# Patient Record
Sex: Male | Born: 1985 | Race: White | Hispanic: No | Marital: Single | Smoking: Never smoker
Health system: Southern US, Community
[De-identification: ages and names within clinical notes are randomized; demographics above are authoritative.]

## PROBLEM LIST (undated history)

## (undated) DIAGNOSIS — G43909 Migraine, unspecified, not intractable, without status migrainosus: Secondary | ICD-10-CM

## (undated) HISTORY — DX: Migraine, unspecified, not intractable, without status migrainosus: G43.909

---

## 2004-04-17 ENCOUNTER — Ambulatory Visit: Payer: Self-pay | Admitting: Internal Medicine

## 2004-05-19 ENCOUNTER — Ambulatory Visit: Payer: Self-pay | Admitting: Internal Medicine

## 2005-05-01 ENCOUNTER — Ambulatory Visit: Payer: Self-pay | Admitting: Family Medicine

## 2005-05-04 ENCOUNTER — Ambulatory Visit: Payer: Self-pay | Admitting: Internal Medicine

## 2005-05-06 ENCOUNTER — Ambulatory Visit: Payer: Self-pay | Admitting: Internal Medicine

## 2005-05-13 ENCOUNTER — Ambulatory Visit: Payer: Self-pay | Admitting: Internal Medicine

## 2005-06-15 ENCOUNTER — Ambulatory Visit: Payer: Self-pay | Admitting: Internal Medicine

## 2006-06-06 ENCOUNTER — Ambulatory Visit: Payer: Self-pay | Admitting: Internal Medicine

## 2008-08-07 ENCOUNTER — Ambulatory Visit: Payer: Self-pay | Admitting: Internal Medicine

## 2008-08-07 DIAGNOSIS — D485 Neoplasm of uncertain behavior of skin: Secondary | ICD-10-CM

## 2008-08-08 ENCOUNTER — Ambulatory Visit: Payer: Self-pay | Admitting: Internal Medicine

## 2008-08-12 LAB — CONVERTED CEMR LAB
ALT: 11 units/L (ref 0–53)
AST: 15 units/L (ref 0–37)
Albumin: 4.3 g/dL (ref 3.5–5.2)
Alkaline Phosphatase: 44 units/L (ref 39–117)
Chloride: 102 meq/L (ref 96–112)
Cholesterol: 179 mg/dL (ref 0–200)
Eosinophils Absolute: 0.1 10*3/uL (ref 0.0–0.7)
Eosinophils Relative: 1.5 % (ref 0.0–5.0)
Ketones, ur: NEGATIVE mg/dL
Leukocytes, UA: NEGATIVE
Lymphocytes Relative: 27.6 % (ref 12.0–46.0)
MCHC: 34.1 g/dL (ref 30.0–36.0)
MCV: 90.6 fL (ref 78.0–100.0)
Neutro Abs: 3.3 10*3/uL (ref 1.4–7.7)
Nitrite: NEGATIVE
Platelets: 173 10*3/uL (ref 150.0–400.0)
RBC: 5.37 M/uL (ref 4.22–5.81)
RDW: 11.7 % (ref 11.5–14.6)
Sodium: 141 meq/L (ref 135–145)
TSH: 1.84 microintl units/mL (ref 0.35–5.50)
Total Protein, Urine: NEGATIVE mg/dL
Urobilinogen, UA: 0.2 (ref 0.0–1.0)
WBC: 5.3 10*3/uL (ref 4.5–10.5)
pH: 7 (ref 5.0–8.0)

## 2008-08-14 ENCOUNTER — Emergency Department (HOSPITAL_BASED_OUTPATIENT_CLINIC_OR_DEPARTMENT_OTHER): Admission: EM | Admit: 2008-08-14 | Discharge: 2008-08-14 | Payer: Self-pay | Admitting: Emergency Medicine

## 2008-08-14 ENCOUNTER — Ambulatory Visit: Payer: Self-pay | Admitting: Internal Medicine

## 2009-01-10 ENCOUNTER — Ambulatory Visit: Payer: Self-pay | Admitting: Internal Medicine

## 2009-01-10 DIAGNOSIS — G43809 Other migraine, not intractable, without status migrainosus: Secondary | ICD-10-CM | POA: Insufficient documentation

## 2009-01-20 ENCOUNTER — Ambulatory Visit: Payer: Self-pay | Admitting: Cardiology

## 2009-06-20 ENCOUNTER — Ambulatory Visit: Payer: Self-pay | Admitting: Internal Medicine

## 2010-05-01 ENCOUNTER — Ambulatory Visit
Admission: RE | Admit: 2010-05-01 | Discharge: 2010-05-01 | Payer: Self-pay | Source: Home / Self Care | Attending: Internal Medicine | Admitting: Internal Medicine

## 2010-05-01 DIAGNOSIS — M79609 Pain in unspecified limb: Secondary | ICD-10-CM | POA: Insufficient documentation

## 2010-05-25 ENCOUNTER — Ambulatory Visit: Admit: 2010-05-25 | Payer: Self-pay | Admitting: Sports Medicine

## 2010-05-29 ENCOUNTER — Telehealth: Payer: Self-pay | Admitting: Internal Medicine

## 2010-06-02 NOTE — Assessment & Plan Note (Signed)
Summary: DR AVP PT COUGH FEVER-VOICE GONE-STC   Vital Signs:  Patient profile:   25 year old male Height:      76 inches Weight:      229 pounds BMI:     27.98 O2 Sat:      97 % on Room air Temp:     99.6 degrees F oral Pulse rate:   85 / minute Pulse rhythm:   regular Resp:     16 per minute BP sitting:   120 / 80  (left arm) Cuff size:   large  Vitals Entered By: Rock Nephew CMA (June 20, 2009 2:53 PM)  Nutrition Counseling: Patient's BMI is greater than 25 and therefore counseled on weight management options.  O2 Flow:  Room air CC: congestion, fever, loss of voice x this am, URI symptoms Is Patient Diabetic? No Pain Assessment Patient in pain? no        Primary Care Provider:  Georgina Quint Plotnikov MD  CC:  congestion, fever, loss of voice x this am, and URI symptoms.  History of Present Illness:  URI Symptoms      This is a 25 year old man who presents with URI symptoms.  The symptoms began 2 days ago.  The severity is described as moderate.  The patient reports nasal congestion, clear nasal discharge, sore throat, and productive cough, but denies earache and sick contacts.  Associated symptoms include low-grade fever (<100.5 degrees).  The patient denies stiff neck, dyspnea, wheezing, rash, vomiting, diarrhea, and use of an antipyretic.  The patient also reports severe fatigue.  The patient denies headache and muscle aches.  The patient denies the following risk factors for Strep sinusitis: unilateral facial pain, unilateral nasal discharge, poor response to decongestant, Strep exposure, tender adenopathy, and absence of cough.    Preventive Screening-Counseling & Management  Alcohol-Tobacco     Alcohol drinks/day: 0     Smoking Status: never  Hep-HIV-STD-Contraception     Hepatitis Risk: no risk noted     HIV Risk: no risk noted     STD Risk: no risk noted      Sexual History:  currently monogamous.        Drug Use:  never.        Blood Transfusions:   no.    Medications Prior to Update: 1)  Imitrex 100 Mg Tabs (Sumatriptan Succinate) .Marland Kitchen.. 1 By Mouth Once Daily As Needed Migraine 2)  Ibuprofen 600 Mg  Tabs (Ibuprofen) .Marland Kitchen.. 1 By Mouth Three Times A Day As Needed Pain 3)  Vitamin D3 1000 Unit  Tabs (Cholecalciferol) .Marland Kitchen.. 1 By Mouth Daily  Current Medications (verified): 1)  Imitrex 100 Mg Tabs (Sumatriptan Succinate) .Marland Kitchen.. 1 By Mouth Once Daily As Needed Migraine 2)  Ibuprofen 600 Mg  Tabs (Ibuprofen) .Marland Kitchen.. 1 By Mouth Three Times A Day As Needed Pain 3)  Vitamin D3 1000 Unit  Tabs (Cholecalciferol) .Marland Kitchen.. 1 By Mouth Daily 4)  Guaifenesin Dac 30-10-100 Mg/64ml Soln (Pseudoephedrine-Codeine-Gg) .... 5-10 Ml By Mouth Qid As Needed For Cough  Allergies (verified): 1)  ! Sodium Sulfate (Sodium Sulfate)  Past History:  Past Medical History: Reviewed history from 01/10/2009 and no changes required. Mono Migraines  Past Surgical History: Reviewed history from 08/07/2008 and no changes required. Denies surgical history  Social History: Hepatitis Risk:  no risk noted HIV Risk:  no risk noted STD Risk:  no risk noted Sexual History:  currently monogamous Drug Use:  never Blood Transfusions:  no  Review of Systems  The patient denies anorexia, fever, weight loss, chest pain, abdominal pain, suspicious skin lesions, and enlarged lymph nodes.    Physical Exam  General:  Well-developed,well-nourished,in no acute distress; alert,appropriate and cooperative throughout examination. he speaks in a whispered voice but has no resp. distress or dysphagia. Head:  normocephalic and atraumatic.   Ears:  R ear normal and L ear normal.   Nose:  External nasal examination shows no deformity or inflammation. Nasal mucosa are pink and moist without lesions or exudates. Mouth:  Oral mucosa and oropharynx without lesions or exudates.  Teeth in good repair. Neck:  No deformities, masses, or tenderness noted. Lungs:  Normal respiratory effort, chest expands  symmetrically. Lungs are clear to auscultation, no crackles or wheezes. Heart:  Normal rate and regular rhythm. S1 and S2 normal without gallop, murmur, click, rub or other extra sounds. Abdomen:  Bowel sounds positive,abdomen soft and non-tender without masses, organomegaly or hernias noted. Msk:  No deformity or scoliosis noted of thoracic or lumbar spine.   Pulses:  R and L carotid,radial,femoral,dorsalis pedis and posterior tibial pulses are full and equal bilaterally Extremities:  No clubbing, cyanosis, edema, or deformity noted with normal full range of motion of all joints.   Neurologic:  No cranial nerve deficits noted. Station and gait are normal. Plantar reflexes are down-going bilaterally. DTRs are symmetrical throughout. Sensory, motor and coordinative functions appear intact. Skin:  Intact without suspicious lesions or rashes Cervical Nodes:  no anterior cervical adenopathy and no posterior cervical adenopathy.   Axillary Nodes:  no R axillary adenopathy and no L axillary adenopathy.   Psych:  Cognition and judgment appear intact. Alert and cooperative with normal attention span and concentration. No apparent delusions, illusions, hallucinations   Impression & Recommendations:  Problem # 1:  COUGH (ICD-786.2) Assessment New  Orders: T-2 View CXR (71020TC)  Problem # 2:  FEVER (ICD-780.60) Assessment: New  Orders: T-2 View CXR (71020TC)  Problem # 3:  BRONCHITIS-ACUTE (ICD-466.0) Assessment: New  start Zithromax for common bacterial and atypical causes. His updated medication list for this problem includes:    Guaifenesin Dac 30-10-100 Mg/64ml Soln (Pseudoephedrine-codeine-gg) .Marland Kitchen... 5-10 ml by mouth qid as needed for cough  Take antibiotics and other medications as directed. Encouraged to push clear liquids, get enough rest, and take acetaminophen as needed. To be seen in 5-7 days if no improvement, sooner if worse.  Complete Medication List: 1)  Imitrex 100 Mg Tabs  (Sumatriptan succinate) .Marland Kitchen.. 1 by mouth once daily as needed migraine 2)  Ibuprofen 600 Mg Tabs (Ibuprofen) .Marland Kitchen.. 1 by mouth three times a day as needed pain 3)  Vitamin D3 1000 Unit Tabs (Cholecalciferol) .Marland Kitchen.. 1 by mouth daily 4)  Guaifenesin Dac 30-10-100 Mg/72ml Soln (Pseudoephedrine-codeine-gg) .... 5-10 ml by mouth qid as needed for cough  Patient Instructions: 1)  Please schedule a follow-up appointment in 2 weeks. 2)  Take your antibiotic as prescribed until ALL of it is gone, but stop if you develop a rash or swelling and contact our office as soon as possible. 3)  Acute bronchitis symptoms for less than 10 days are not helped by antibiotics. take over the counter cough medications. call if no improvment in  5-7 days, sooner if increasing cough, fever, or new symptoms( shortness of breath, chest pain). Prescriptions: GUAIFENESIN DAC 30-10-100 MG/5ML SOLN (PSEUDOEPHEDRINE-CODEINE-GG) 5-10 ml by mouth QID as needed for cough  #6 ounces x 0   Entered and Authorized by:   Bernadene Bell.  Yetta Barre MD   Signed by:   Etta Grandchild MD on 06/20/2009   Method used:   Print then Give to Patient   RxID:   [id number] ZITHROMAX TRI-PAK 500 MG TAB (AZITHROMYCIN) Take one by mouth once daily for 3 days  #3 x 0   Entered and Authorized by:   Etta Grandchild MD   Signed by:   Etta Grandchild MD on 06/20/2009   Method used:   Print then Give to Patient   RxID:   [id number]

## 2010-06-04 NOTE — Assessment & Plan Note (Signed)
Summary: foot injury/nws   Vital Signs:  Patient profile:   25 year old male Height:      76 inches Weight:      248 pounds BMI:     30.30 Temp:     97.4 degrees F oral Pulse rate:   72 / minute Pulse rhythm:   regular Resp:     16 per minute BP sitting:   140 / 100  (left arm) Cuff size:   regular  Vitals Entered By: Lanier Prude, Beverly Gust) (May 01, 2010 4:15 PM) CC: Rt leg pain below knee from injury/fall 2 wks ago Is Patient Diabetic? No Comments pt states he does not take Imitrex, ibuprofen, vit d or Guaifenesin   Primary Care Provider:  Tresa Garter MD  CC:  Rt leg pain below knee from injury/fall 2 wks ago.  History of Present Illness: C/o R leg pain x several weeks. It started after he fell backwards with the weight on R heel - he developed sudden pain in proximal and posterolat. shin. It is worse w/activity  Preventive Screening-Counseling & Management  Caffeine-Diet-Exercise     Does Patient Exercise: yes  Current Medications (verified): 1)  Imitrex 100 Mg Tabs (Sumatriptan Succinate) .Marland Kitchen.. 1 By Mouth Once Daily As Needed Migraine 2)  Ibuprofen 600 Mg  Tabs (Ibuprofen) .Marland Kitchen.. 1 By Mouth Three Times A Day As Needed Pain 3)  Vitamin D3 1000 Unit  Tabs (Cholecalciferol) .Marland Kitchen.. 1 By Mouth Daily 4)  Guaifenesin Dac 30-10-100 Mg/4ml Soln (Pseudoephedrine-Codeine-Gg) .... 5-10 Ml By Mouth Qid As Needed For Cough 5)  One-A-Day Mens  Tabs (Multiple Vitamin) .Marland Kitchen.. 1 By Mouth Once Daily  Allergies (verified): 1)  ! Sodium Sulfate (Sodium Sulfate)  Past History:  Past Medical History: Last updated: 01/10/2009 Mono Migraines  Social History: Occupation: Biology major Single Never Smoked Alcohol use-no Regular exercise-yes - self defence Does Patient Exercise:  yes  Review of Systems  The patient denies fever.         No LBP, no foot pain  Physical Exam  General:  Well-developed,well-nourished,in no acute distress; alert,appropriate and  cooperative throughout examination Msk:  R leg is NT Pulses:  R and L carotid,radial,femoral,dorsalis pedis and posterior tibial pulses are full and equal bilaterally Extremities:  months edema B Neurologic:  No cranial nerve deficits noted. Station and gait are normal. Plantar reflexes are down-going bilaterally. DTRs are symmetrical throughout. Sensory, motor and coordinative functions appear intact. Skin:  Feet w/old scars Psych:  Cognition and judgment appear intact. Alert and cooperative with normal attention span and concentration. No apparent delusions, illusions, hallucinations   Impression & Recommendations:  Problem # 1:  LEG PAIN (ICD-729.5) R lat, MSK Assessment New  Will give a course of Ibuprofen Sports Med consult Orders: T-Tib/Fib Right (73590TC)  Complete Medication List: 1)  Imitrex 100 Mg Tabs (Sumatriptan succinate) .Marland Kitchen.. 1 by mouth once daily as needed migraine 2)  Ibuprofen 600 Mg Tabs (Ibuprofen) .Marland Kitchen.. 1 by mouth two times a day x 2 wks then as needed pain 3)  Vitamin D3 1000 Unit Tabs (Cholecalciferol) .Marland Kitchen.. 1 by mouth daily 4)  Guaifenesin Dac 30-10-100 Mg/68ml Soln (Pseudoephedrine-codeine-gg) .... 5-10 ml by mouth qid as needed for cough 5)  One-a-day Mens Tabs (Multiple vitamin) .Marland Kitchen.. 1 by mouth once daily  Other Orders: Sports Medicine (Sports Med)  Patient Instructions: 1)  Call if you are not better in a reasonable amount of time or if worse.  Prescriptions: IBUPROFEN 600 MG  TABS (IBUPROFEN)  1 by mouth two times a day x 2 wks then as needed pain  #60 x 1   Entered and Authorized by:   Tresa Garter MD   Signed by:   Tresa Garter MD on 05/01/2010   Method used:   Print then Give to Patient   RxID:   [id number]    Orders Added: 1)  T-Tib/Fib Right [73590TC] 2)  Sports Medicine [Sports Med] 3)  Est. Patient Level III [75643]

## 2010-06-04 NOTE — Progress Notes (Signed)
Summary: Sore throat, cough  Phone Note Call from Patient   Caller: Patient--call [phone] Call For: Tresa Garter MD Summary of Call: Pt has bad cough,fever last pm,no sore throat. Pt requests office visit today for med if needed. We have no openings in clinic today with any provider.Pt requests appt today, can he be worked in any place today? Initial call taken by: Verdell Face,  May 29, 2010 10:28 AM  Follow-up for Phone Call        pt advised to use OTC robitussin or Delsym for cough, Tylenol for fever and Ricola or Halls losenges for sore throat/cough. I informed him our saturday clinic is available tom from 9am - 11am to walk-in if symptoms worsen. Follow-up by: Lanier Prude, [facility]),  May 29, 2010 10:52 AM  Additional Follow-up for Phone Call Additional follow up Details #1::        OK this pm if he needs to be seen Thank you!  Additional Follow-up by: Tresa Garter MD,  May 29, 2010 1:35 PM    Additional Follow-up for Phone Call Additional follow up Details #2::    pt offered Sat appt and is aware to call Saturday clinic or walk in if no relieve in symptoms. Follow-up by: Lanier Prude, [facility]),  May 29, 2010 3:51 PM

## 2011-03-29 ENCOUNTER — Ambulatory Visit (INDEPENDENT_AMBULATORY_CARE_PROVIDER_SITE_OTHER): Payer: Commercial Managed Care - PPO | Admitting: Internal Medicine

## 2011-03-29 ENCOUNTER — Encounter: Payer: Self-pay | Admitting: Internal Medicine

## 2011-03-29 VITALS — BP 110/70 | HR 105 | Temp 101.8°F

## 2011-03-29 DIAGNOSIS — J069 Acute upper respiratory infection, unspecified: Secondary | ICD-10-CM

## 2011-03-29 MED ORDER — OSELTAMIVIR PHOSPHATE 75 MG PO CAPS: 75.0000 mg | ORAL_CAPSULE | Freq: Two times a day (BID) | ORAL | Status: DC

## 2011-03-29 MED ORDER — OSELTAMIVIR PHOSPHATE 75 MG PO CAPS: 75.0000 mg | ORAL_CAPSULE | Freq: Two times a day (BID) | ORAL | Status: AC

## 2011-03-29 NOTE — Progress Notes (Signed)
Addended by: Deatra James on: 03/29/2011 04:43 PM   Modules accepted: Orders

## 2011-03-29 NOTE — Progress Notes (Signed)
  Subjective:    HPI  complains of flu symptoms  Onset 24 hours ago - but myalgia and fatigue for last 2 weeks  associated with rhinorrhea, sneezing, mild sore throat, mild headache and fever Also diffuse myalgias, sinus pressure and mild-mod head congestion No significant cough, dry when present No relief with OTC meds Precipitated by sick contacts  Past Medical History  Diagnosis Date  . Migraine headache     Review of Systems Constitutional: No night sweats, no unexpected weight change Pulmonary: No pleurisy or hemoptysis Cardiovascular: No chest pain or palpitations     Objective:   Physical Exam BP 110/70  Pulse 105  Temp(Src) 101.8 F (38.8 C) (Oral)  SpO2 97% GEN: mildly ill appearing and audible head congestion HENT: NCAT, mild sinus tenderness bilaterally, nares with clear discharge, oropharynx mild erythema, no exudate Eyes: Vision grossly intact, no conjunctivitis; glassy and watery appearance Lungs: Clear to auscultation without rhonchi or wheeze, no increased work of breathing Cardiovascular: Regular rate and rhythm, no bilateral edema      Assessment & Plan:  Viral URI > possible influenza   Explained lack of efficacy for antibiotics in viral disease Empiric Tamiflu due to classic symptoms  Advised to remain out of work during fever - at least next 48 Symptomatic care with Tylenol or Advil, hydration and rest -

## 2011-03-29 NOTE — Patient Instructions (Signed)
It was good to see you today. Tamiflu twice a day for 5 days to treat flulike symptoms Use Advil and/or Tylenol every 4 hours for fever and aches Plenty of hydration advised and rest Remain out of work until fever resolved for at least 24 hours - we can provide work note if needed

## 2011-04-16 ENCOUNTER — Encounter: Payer: Self-pay | Admitting: Internal Medicine

## 2011-04-16 ENCOUNTER — Ambulatory Visit (INDEPENDENT_AMBULATORY_CARE_PROVIDER_SITE_OTHER): Payer: Commercial Managed Care - PPO | Admitting: Internal Medicine

## 2011-04-16 DIAGNOSIS — J029 Acute pharyngitis, unspecified: Secondary | ICD-10-CM | POA: Insufficient documentation

## 2011-04-16 DIAGNOSIS — H6690 Otitis media, unspecified, unspecified ear: Secondary | ICD-10-CM

## 2011-04-16 DIAGNOSIS — H6591 Unspecified nonsuppurative otitis media, right ear: Secondary | ICD-10-CM | POA: Insufficient documentation

## 2011-04-16 MED ORDER — IBUPROFEN 600 MG PO TABS: ORAL_TABLET | ORAL | Status: AC

## 2011-04-16 MED ORDER — AZITHROMYCIN 250 MG PO TABS: ORAL_TABLET | ORAL | Status: AC

## 2011-04-16 NOTE — Assessment & Plan Note (Signed)
Zpac Ibuprofen prn

## 2011-04-16 NOTE — Progress Notes (Signed)
  Subjective:    Patient ID: Ralph Webster, male    DOB: Nov 11, 1985, 25 y.o.   MRN: [id number]  HPI  C/o R earache and R ST x 1 wk  Review of Systems  Constitutional: Negative for fever.  HENT: Positive for ear pain, sore throat and trouble swallowing. Negative for nosebleeds, congestion and sinus pressure.   Musculoskeletal: Negative for arthralgias.       Objective:   Physical Exam  Constitutional: He is oriented to person, place, and time. He appears well-developed and well-nourished. No distress.  HENT:  Mouth/Throat: Oropharyngeal exudate (R) present.       R tonsil is swoller, eryth  R TM red  Cardiovascular: Normal rate and normal heart sounds.   Pulmonary/Chest: He has no wheezes. He has no rales.  Lymphadenopathy:    He has cervical adenopathy (R mild).  Neurological: He is alert and oriented to person, place, and time.          Assessment & Plan:

## 2011-04-16 NOTE — Assessment & Plan Note (Signed)
Zpac 

## 2011-12-10 ENCOUNTER — Ambulatory Visit (INDEPENDENT_AMBULATORY_CARE_PROVIDER_SITE_OTHER): Payer: Commercial Managed Care - PPO | Admitting: Internal Medicine

## 2011-12-10 ENCOUNTER — Encounter: Payer: Self-pay | Admitting: Internal Medicine

## 2011-12-10 VITALS — BP 120/84 | HR 64 | Temp 98.2°F | Resp 17 | Wt 247.0 lb

## 2011-12-10 DIAGNOSIS — H109 Unspecified conjunctivitis: Secondary | ICD-10-CM | POA: Insufficient documentation

## 2011-12-10 MED ORDER — TOBRAMYCIN-DEXAMETHASONE 0.3-0.1 % OP SUSP: 1.0000 [drp] | OPHTHALMIC | Status: AC

## 2011-12-10 NOTE — Progress Notes (Signed)
  Subjective:    Patient ID: Ralph Webster, male    DOB: 07-Dec-1985, 26 y.o.   MRN: [id number]  Conjunctivitis  The current episode started today. The onset was sudden. The problem occurs continuously. The problem has been unchanged. The problem is mild. Associated symptoms include eye discharge and eye redness. Pertinent negatives include no orthopnea, no fever, no decreased vision, no double vision, no eye itching, no photophobia, no abdominal pain, no congestion, no ear discharge, no ear pain, no headaches, no hearing loss, no mouth sores, no rhinorrhea, no sore throat, no stridor, no swollen glands, no muscle aches, no neck pain, no neck stiffness, no cough, no URI, no rash and no eye pain.      Review of Systems  Constitutional: Negative.  Negative for fever.  HENT: Negative.  Negative for hearing loss, ear pain, congestion, sore throat, rhinorrhea, mouth sores, neck pain and ear discharge.   Eyes: Positive for discharge and redness. Negative for double vision, photophobia, pain, itching and visual disturbance.  Respiratory: Negative.  Negative for cough and stridor.   Cardiovascular: Negative.  Negative for orthopnea.  Gastrointestinal: Negative.  Negative for abdominal pain.  Genitourinary: Negative.   Musculoskeletal: Negative.   Skin: Negative.  Negative for rash.  Neurological: Negative.  Negative for headaches.  Hematological: Negative for adenopathy. Does not bruise/bleed easily.       Objective:   Physical Exam  Vitals reviewed. Constitutional: He is oriented to person, place, and time. He appears well-developed and well-nourished. No distress.  HENT:  Head: Normocephalic and atraumatic.  Mouth/Throat: Oropharynx is clear and moist. No oropharyngeal exudate.  Eyes: EOM and lids are normal. Pupils are equal, round, and reactive to light. No foreign bodies found. Right eye exhibits no chemosis, no discharge, no exudate and no hordeolum. No foreign body present in the right  eye. Left eye exhibits no chemosis, no discharge, no exudate and no hordeolum. No foreign body present in the left eye. Right conjunctiva is injected. Right conjunctiva has no hemorrhage. Left conjunctiva is not injected. Left conjunctiva has no hemorrhage. No scleral icterus.    Neck: Normal range of motion. Neck supple. No JVD present. No tracheal deviation present. No thyromegaly present.  Cardiovascular: Normal rate, regular rhythm, normal heart sounds and intact distal pulses.  Exam reveals no gallop and no friction rub.   No murmur heard. Pulmonary/Chest: Effort normal and breath sounds normal. No stridor. No respiratory distress. He has no wheezes. He has no rales. He exhibits no tenderness.  Abdominal: Soft. Bowel sounds are normal. He exhibits no distension and no mass. There is no tenderness. There is no rebound and no guarding.  Musculoskeletal: Normal range of motion. He exhibits no edema and no tenderness.  Lymphadenopathy:    He has no cervical adenopathy.  Neurological: He is oriented to person, place, and time.  Skin: Skin is warm and dry. No rash noted. He is not diaphoretic. No erythema. No pallor.  Psychiatric: He has a normal mood and affect. His behavior is normal. Judgment and thought content normal.          Assessment & Plan:

## 2011-12-10 NOTE — Patient Instructions (Addendum)
Conjunctivitis Conjunctivitis is commonly called "pink eye." Conjunctivitis can be caused by bacterial or viral infection, allergies, or injuries. There is usually redness of the lining of the eye, itching, discomfort, and sometimes discharge. There may be deposits of matter along the eyelids. A viral infection usually causes a watery discharge, while a bacterial infection causes a yellowish, thick discharge. Pink eye is very contagious and spreads by direct contact. You may be given antibiotic eyedrops as part of your treatment. Before using your eye medicine, remove all drainage from the eye by washing gently with warm water and cotton balls. Continue to use the medication until you have awakened 2 mornings in a row without discharge from the eye. Do not rub your eye. This increases the irritation and helps spread infection. Use separate towels from other household members. Wash your hands with soap and water before and after touching your eyes. Use cold compresses to reduce pain and sunglasses to relieve irritation from light. Do not wear contact lenses or wear eye makeup until the infection is gone. SEEK MEDICAL CARE IF:   Your symptoms are not better after 3 days of treatment.   You have increased pain or trouble seeing.   The outer eyelids become very red or swollen.  Document Released: 05/27/2004 Document Revised: 04/08/2011 Document Reviewed: 04/19/2005 [employer] Patient Information 2012 [employer]. 

## 2011-12-10 NOTE — Assessment & Plan Note (Signed)
Will treat with tobradex eye drops and pt ed material

## 2011-12-28 IMAGING — CR DG TIBIA/FIBULA 2V*R*
4 series · 4 of 4 positions shown · non-contrast
Comparison: None.

CLINICAL DATA: Right tib-fib injury with pain.

RIGHT TIBIA AND FIBULA - 2 VIEW

[view not recorded (1 of 4)]
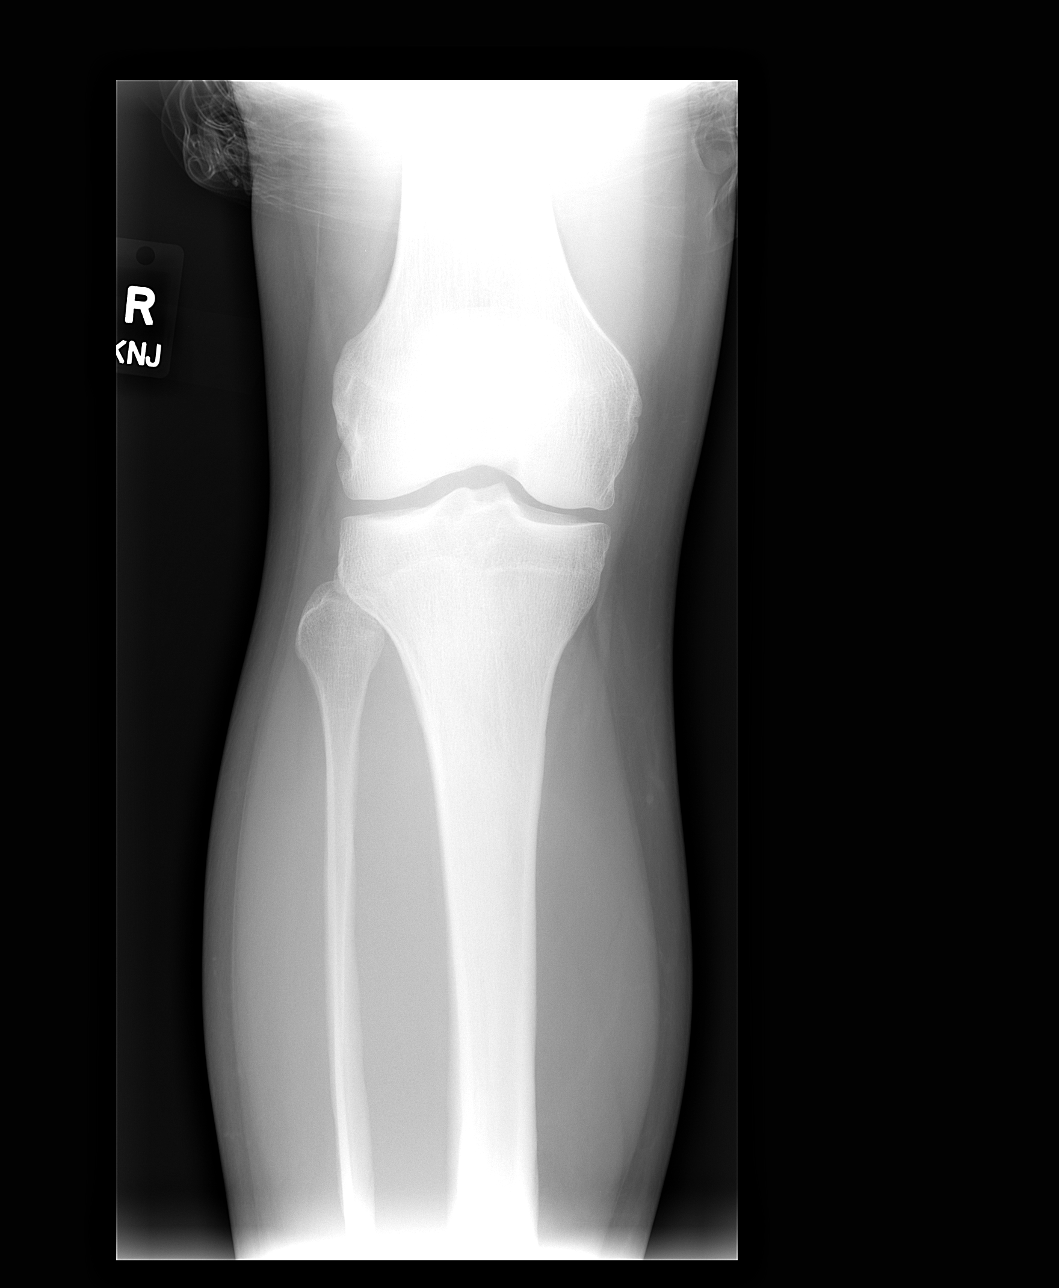

[view not recorded (2 of 4)]
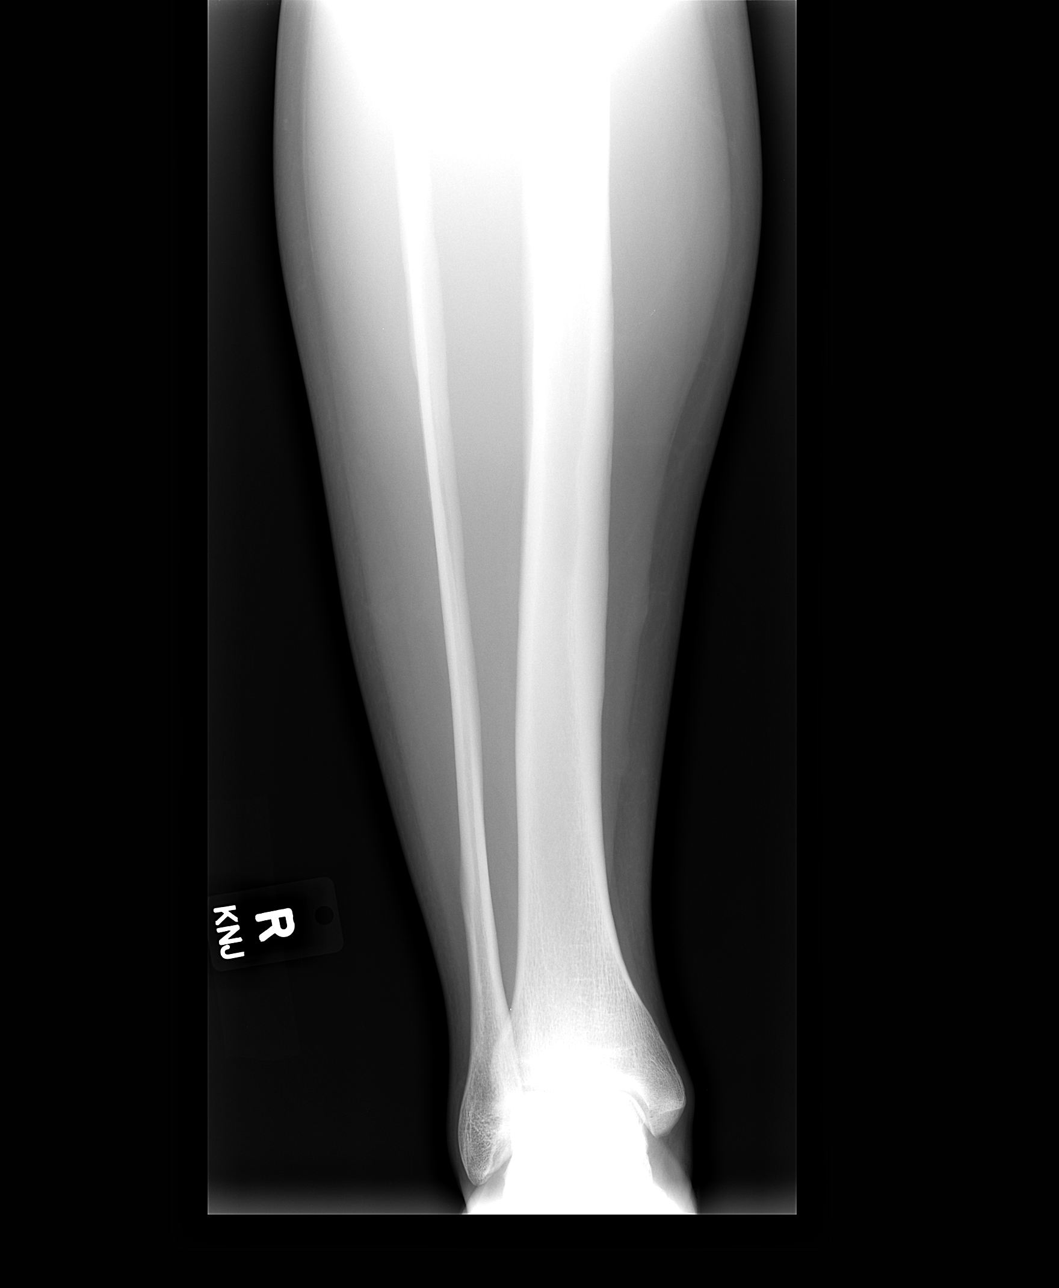

[view not recorded (3 of 4)]
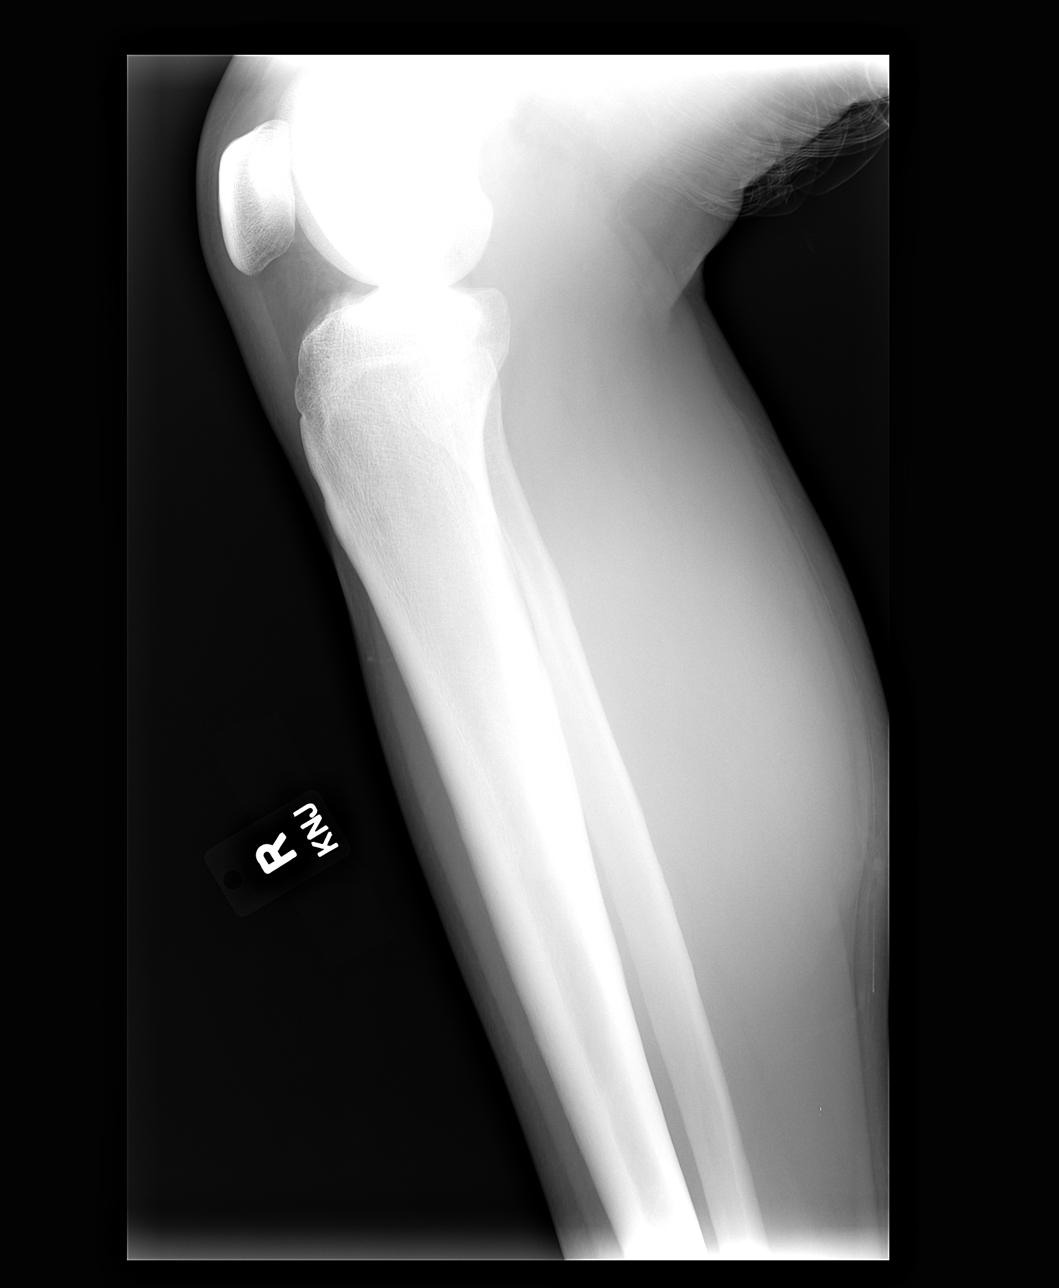

[view not recorded (4 of 4)]
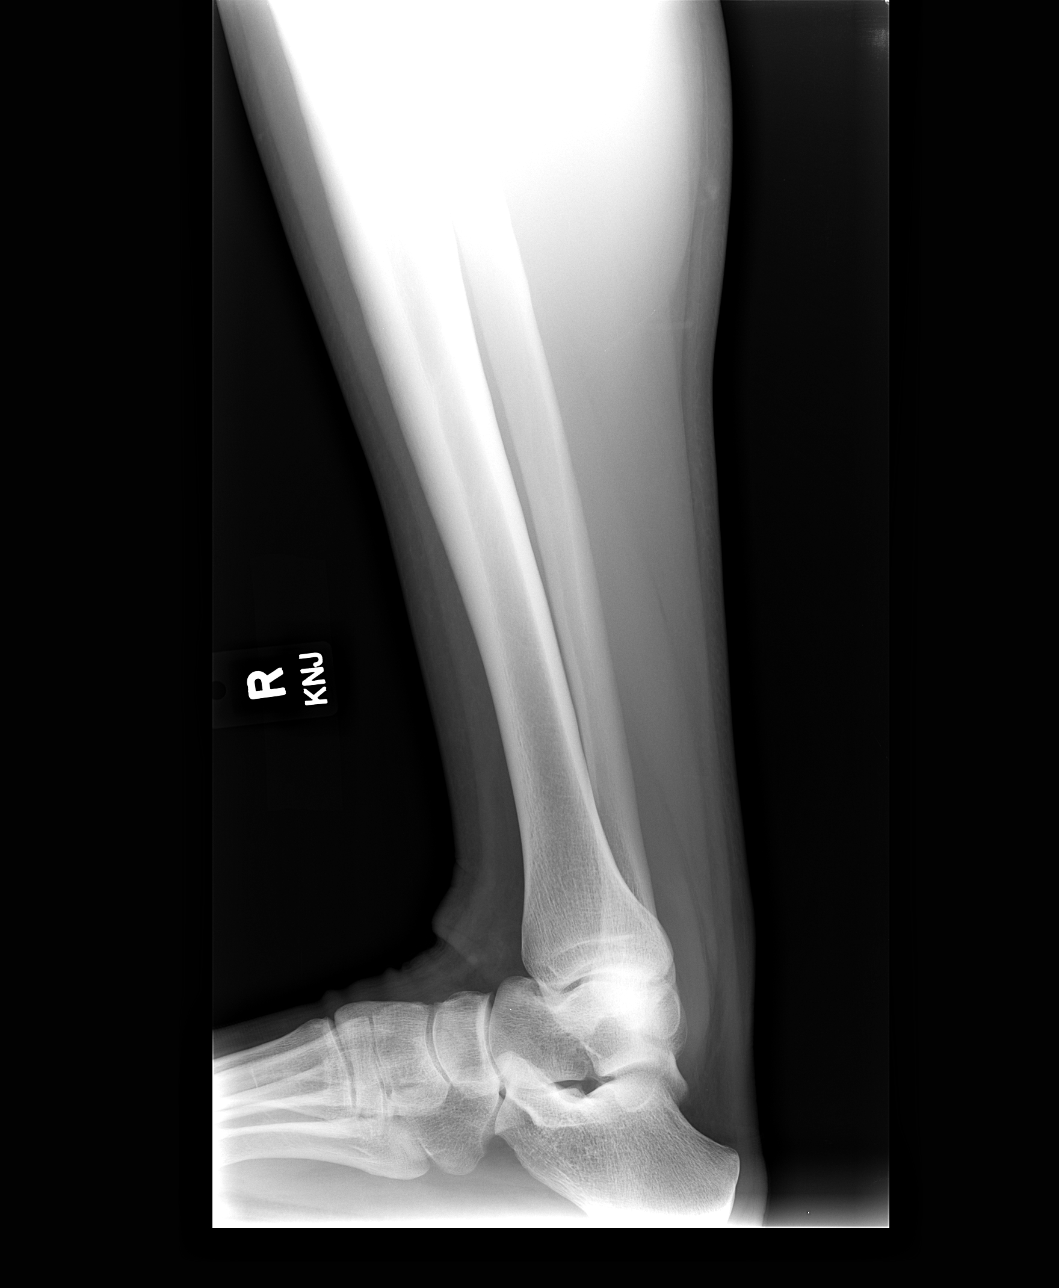

[4 of 4 positions shown; findings below may reference images not displayed]

FINDINGS: Two-view exam shows no evidence for fracture.  There is
no worrisome lytic or sclerotic osseous abnormality.
IMPRESSION: Normal exam of the right tibia and fibula.

## 2012-05-19 ENCOUNTER — Encounter: Payer: Self-pay | Admitting: Internal Medicine

## 2012-05-19 ENCOUNTER — Ambulatory Visit (INDEPENDENT_AMBULATORY_CARE_PROVIDER_SITE_OTHER)
Admission: RE | Admit: 2012-05-19 | Discharge: 2012-05-19 | Disposition: A | Discharge status: Home / Self Care | Payer: Commercial Managed Care - PPO | Source: Ambulatory Visit | Attending: Internal Medicine | Admitting: Internal Medicine

## 2012-05-19 ENCOUNTER — Ambulatory Visit (INDEPENDENT_AMBULATORY_CARE_PROVIDER_SITE_OTHER): Payer: Commercial Managed Care - PPO | Admitting: Internal Medicine

## 2012-05-19 VITALS — BP 130/80 | HR 80 | Temp 100.2°F | Resp 16 | Wt 249.0 lb

## 2012-05-19 DIAGNOSIS — R05 Cough: Secondary | ICD-10-CM

## 2012-05-19 DIAGNOSIS — R059 Cough, unspecified: Secondary | ICD-10-CM

## 2012-05-19 DIAGNOSIS — J069 Acute upper respiratory infection, unspecified: Secondary | ICD-10-CM

## 2012-05-19 MED ORDER — AZITHROMYCIN 250 MG PO TABS: ORAL_TABLET | ORAL | Status: DC

## 2012-05-19 MED ORDER — PROMETHAZINE-CODEINE 6.25-10 MG/5ML PO SYRP: 5.0000 mL | ORAL_SOLUTION | ORAL | Status: DC | PRN

## 2012-05-19 NOTE — Patient Instructions (Addendum)

## 2012-05-19 NOTE — Progress Notes (Signed)
  Subjective:    Fever  This is a new problem. The maximum temperature noted was 99 to 99.9 F. Associated symptoms include congestion, coughing and a sore throat. Pertinent negatives include no ear pain.  Cough This is a new problem. The current episode started in the past 7 days. Associated symptoms include a fever, postnasal drip and a sore throat. Pertinent negatives include no ear pain. There is no history of asthma.      Review of Systems  Constitutional: Positive for fever.  HENT: Positive for congestion, sore throat, trouble swallowing and postnasal drip. Negative for ear pain and nosebleeds.   Respiratory: Positive for cough.   Musculoskeletal: Negative for arthralgias.       Objective:   Physical Exam  Constitutional: He is oriented to person, place, and time. He appears well-developed and well-nourished. No distress.  HENT:  Mouth/Throat: No oropharyngeal exudate (R).       Eryth throat  Cardiovascular: Normal rate and normal heart sounds.   Pulmonary/Chest: He has no wheezes. He has no rales.  Lymphadenopathy:    He has cervical adenopathy (R mild).  Neurological: He is alert and oriented to person, place, and time.   Flu test neg       Assessment & Plan:

## 2012-05-20 ENCOUNTER — Encounter: Payer: Self-pay | Admitting: Internal Medicine

## 2012-05-20 ENCOUNTER — Telehealth: Payer: Self-pay | Admitting: Internal Medicine

## 2012-05-20 DIAGNOSIS — J069 Acute upper respiratory infection, unspecified: Secondary | ICD-10-CM | POA: Insufficient documentation

## 2012-05-20 NOTE — Assessment & Plan Note (Signed)
Flu test CXR Prom-cod Zpac if worse To work next wk if ok

## 2012-05-20 NOTE — Telephone Encounter (Signed)
Ralph Webster, please, inform patient that his CXR show bronchitis Thx

## 2012-05-22 NOTE — Telephone Encounter (Signed)
Pt informed

## 2012-12-07 ENCOUNTER — Telehealth: Payer: Self-pay | Admitting: Internal Medicine

## 2012-12-07 NOTE — Telephone Encounter (Signed)
Pt request a letter from Dr. Macario Golds stating that he is allergic to sulfa drugs and its causes him to break out in a rash. Please also mention in the letter that it is not life threatening. Please call pt to pick this up if this is ok. This letter is to present to [employer].

## 2012-12-08 NOTE — Telephone Encounter (Signed)
Ok Done Thx 

## 2012-12-08 NOTE — Telephone Encounter (Signed)
Called pt to inform the letter is ready. Pt is aware

## 2013-01-05 ENCOUNTER — Encounter: Payer: Self-pay | Admitting: Internal Medicine

## 2013-01-05 ENCOUNTER — Ambulatory Visit (INDEPENDENT_AMBULATORY_CARE_PROVIDER_SITE_OTHER): Payer: Commercial Managed Care - PPO | Admitting: Internal Medicine

## 2013-01-05 VITALS — BP 144/98 | HR 80 | Temp 99.2°F | Wt 235.0 lb

## 2013-01-05 DIAGNOSIS — J019 Acute sinusitis, unspecified: Secondary | ICD-10-CM | POA: Insufficient documentation

## 2013-01-05 MED ORDER — CEFUROXIME AXETIL 500 MG PO TABS: ORAL_TABLET | ORAL | Status: DC

## 2013-01-05 NOTE — Patient Instructions (Addendum)
BP Readings from Last 3 Encounters:  01/05/13 144/98  05/19/12 130/80  12/10/11 120/84    Use over-the-counter  "cold" medicines  such as "Tylenol cold" , "Advil cold",  "Mucinex" or" Mucinex D"  for cough and congestion.   Avoid decongestants if you have high blood pressure and use "Afrin" nasal spray for nasal congestion as directed instead. Use" Delsym" or" Robitussin" cough syrup varietis for cough.  You can use plain "Tylenol" or "Advil" for fever, chills and achyness.   "Common cold" symptoms are usually triggered by a virus.  The antibiotics are usually not necessary. On average, a" viral cold" illness would take 4-7 days to resolve. Please, make an appointment if you are not better or if you're worse.

## 2013-01-05 NOTE — Assessment & Plan Note (Signed)
Ceftin

## 2013-01-05 NOTE — Progress Notes (Signed)
Patient ID: Ralph Webster, male   DOB: 1986-02-20, 27 y.o.   MRN: [id number]  Subjective:    Sinusitis This is a new problem. The current episode started in the past 7 days. Associated symptoms include congestion, coughing and a sore throat.      Review of Systems  HENT: Positive for congestion, sore throat, rhinorrhea and trouble swallowing. Negative for nosebleeds.   Respiratory: Positive for cough.   Musculoskeletal: Negative for arthralgias.       Objective:   Physical Exam  Constitutional: He is oriented to person, place, and time. He appears well-developed and well-nourished. No distress.  HENT:  Mouth/Throat: No oropharyngeal exudate (R).  Eryth throat  Cardiovascular: Normal rate and normal heart sounds.   Pulmonary/Chest: He has no wheezes. He has no rales.  Musculoskeletal: He exhibits no tenderness.  Lymphadenopathy:    He has cervical adenopathy (R mild).  Neurological: He is alert and oriented to person, place, and time.          Assessment & Plan:

## 2013-01-09 ENCOUNTER — Telehealth: Payer: Self-pay | Admitting: *Deleted

## 2013-01-09 NOTE — Telephone Encounter (Signed)
Left detailed mess informing pt the letter he requested form Dr. Posey Rea is ready for p/u at our front desk.

## 2013-06-08 ENCOUNTER — Ambulatory Visit (INDEPENDENT_AMBULATORY_CARE_PROVIDER_SITE_OTHER): Payer: Commercial Managed Care - PPO | Admitting: Internal Medicine

## 2013-06-08 ENCOUNTER — Encounter: Payer: Self-pay | Admitting: Internal Medicine

## 2013-06-08 VITALS — BP 130/90 | HR 68 | Temp 96.9°F | Resp 16 | Wt 229.0 lb

## 2013-06-08 DIAGNOSIS — J019 Acute sinusitis, unspecified: Secondary | ICD-10-CM

## 2013-06-08 MED ORDER — AMOXICILLIN-POT CLAVULANATE 875-125 MG PO TABS: 1.0000 | ORAL_TABLET | Freq: Two times a day (BID) | ORAL | Status: DC

## 2013-06-08 MED ORDER — PSEUDOEPHEDRINE HCL ER 120 MG PO TB12: 120.0000 mg | ORAL_TABLET | Freq: Two times a day (BID) | ORAL | Status: DC | PRN

## 2013-06-08 NOTE — Progress Notes (Signed)
   Subjective:    Patient ID: Ralph Webster, male    DOB: 1986/02/20, 28 y.o.   MRN: [id number]  URI  This is a new problem. The current episode started in the past 7 days. The problem has been gradually worsening. The maximum temperature recorded prior to his arrival was 101 - 101.9 F. The fever has been present for 1 to 2 days. Associated symptoms include congestion, coughing, headaches, rhinorrhea, sinus pain and a sore throat. Pertinent negatives include no wheezing. He has tried acetaminophen for the symptoms. The treatment provided no relief.  Brown d/c    Review of Systems  HENT: Positive for congestion, rhinorrhea and sore throat.   Respiratory: Positive for cough. Negative for wheezing.   Neurological: Positive for headaches.       Objective:   Physical Exam  Constitutional: He is oriented to person, place, and time. He appears well-developed. No distress.  NAD Tired  HENT:  Mouth/Throat: No oropharyngeal exudate.  eryth throat Dark d/c B nares  Eyes: Conjunctivae are normal. Pupils are equal, round, and reactive to light.  Neck: Normal range of motion. No JVD present. No thyromegaly present.  Cardiovascular: Normal rate, regular rhythm, normal heart sounds and intact distal pulses.  Exam reveals no gallop and no friction rub.   No murmur heard. Pulmonary/Chest: Effort normal and breath sounds normal. No respiratory distress. He has no wheezes. He has no rales. He exhibits no tenderness.  Abdominal: Soft. Bowel sounds are normal. He exhibits no distension and no mass. There is no tenderness. There is no rebound and no guarding.  Musculoskeletal: Normal range of motion. He exhibits no edema and no tenderness.  Lymphadenopathy:    He has no cervical adenopathy.  Neurological: He is alert and oriented to person, place, and time. He has normal reflexes. No cranial nerve deficit. He exhibits normal muscle tone. He displays a negative Romberg sign. Coordination and gait normal.    No meningeal signs  Skin: Skin is warm and dry. No rash noted.  Psychiatric: He has a normal mood and affect. His behavior is normal. Judgment and thought content normal.          Assessment & Plan:

## 2013-06-08 NOTE — Assessment & Plan Note (Signed)
Augmentin  Sudafed prn OTC meds prn

## 2013-06-08 NOTE — Progress Notes (Signed)
Pre visit review using our clinic review tool, if applicable. No additional management support is needed unless otherwise documented below in the visit note. 

## 2014-01-02 ENCOUNTER — Encounter: Payer: Self-pay | Admitting: Internal Medicine

## 2014-01-02 ENCOUNTER — Ambulatory Visit (INDEPENDENT_AMBULATORY_CARE_PROVIDER_SITE_OTHER): Payer: Commercial Managed Care - PPO | Admitting: Internal Medicine

## 2014-01-02 VITALS — BP 120/70 | HR 62 | Temp 98.0°F | Wt 232.5 lb

## 2014-01-02 DIAGNOSIS — L03119 Cellulitis of unspecified part of limb: Secondary | ICD-10-CM

## 2014-01-02 DIAGNOSIS — L02519 Cutaneous abscess of unspecified hand: Secondary | ICD-10-CM

## 2014-01-02 DIAGNOSIS — IMO0002 Reserved for concepts with insufficient information to code with codable children: Secondary | ICD-10-CM

## 2014-01-02 MED ORDER — AMOXICILLIN-POT CLAVULANATE 875-125 MG PO TABS: 1.0000 | ORAL_TABLET | Freq: Two times a day (BID) | ORAL | Status: DC
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
## 2014-01-02 NOTE — Progress Notes (Signed)
Pre visit review using our clinic review tool, if applicable. No additional management support is needed unless otherwise documented below in the visit note. 

## 2014-01-02 NOTE — Patient Instructions (Signed)
The Hand Surgery referral will be scheduled and you'll be notified of the time. 

## 2014-01-15 IMAGING — CR DG CHEST 2V
2 series · 2 of 2 positions shown · non-contrast
Comparison: 06/20/2009

CLINICAL DATA: Fever, cough.

CHEST - 2 VIEW

[view not recorded (1 of 2)]
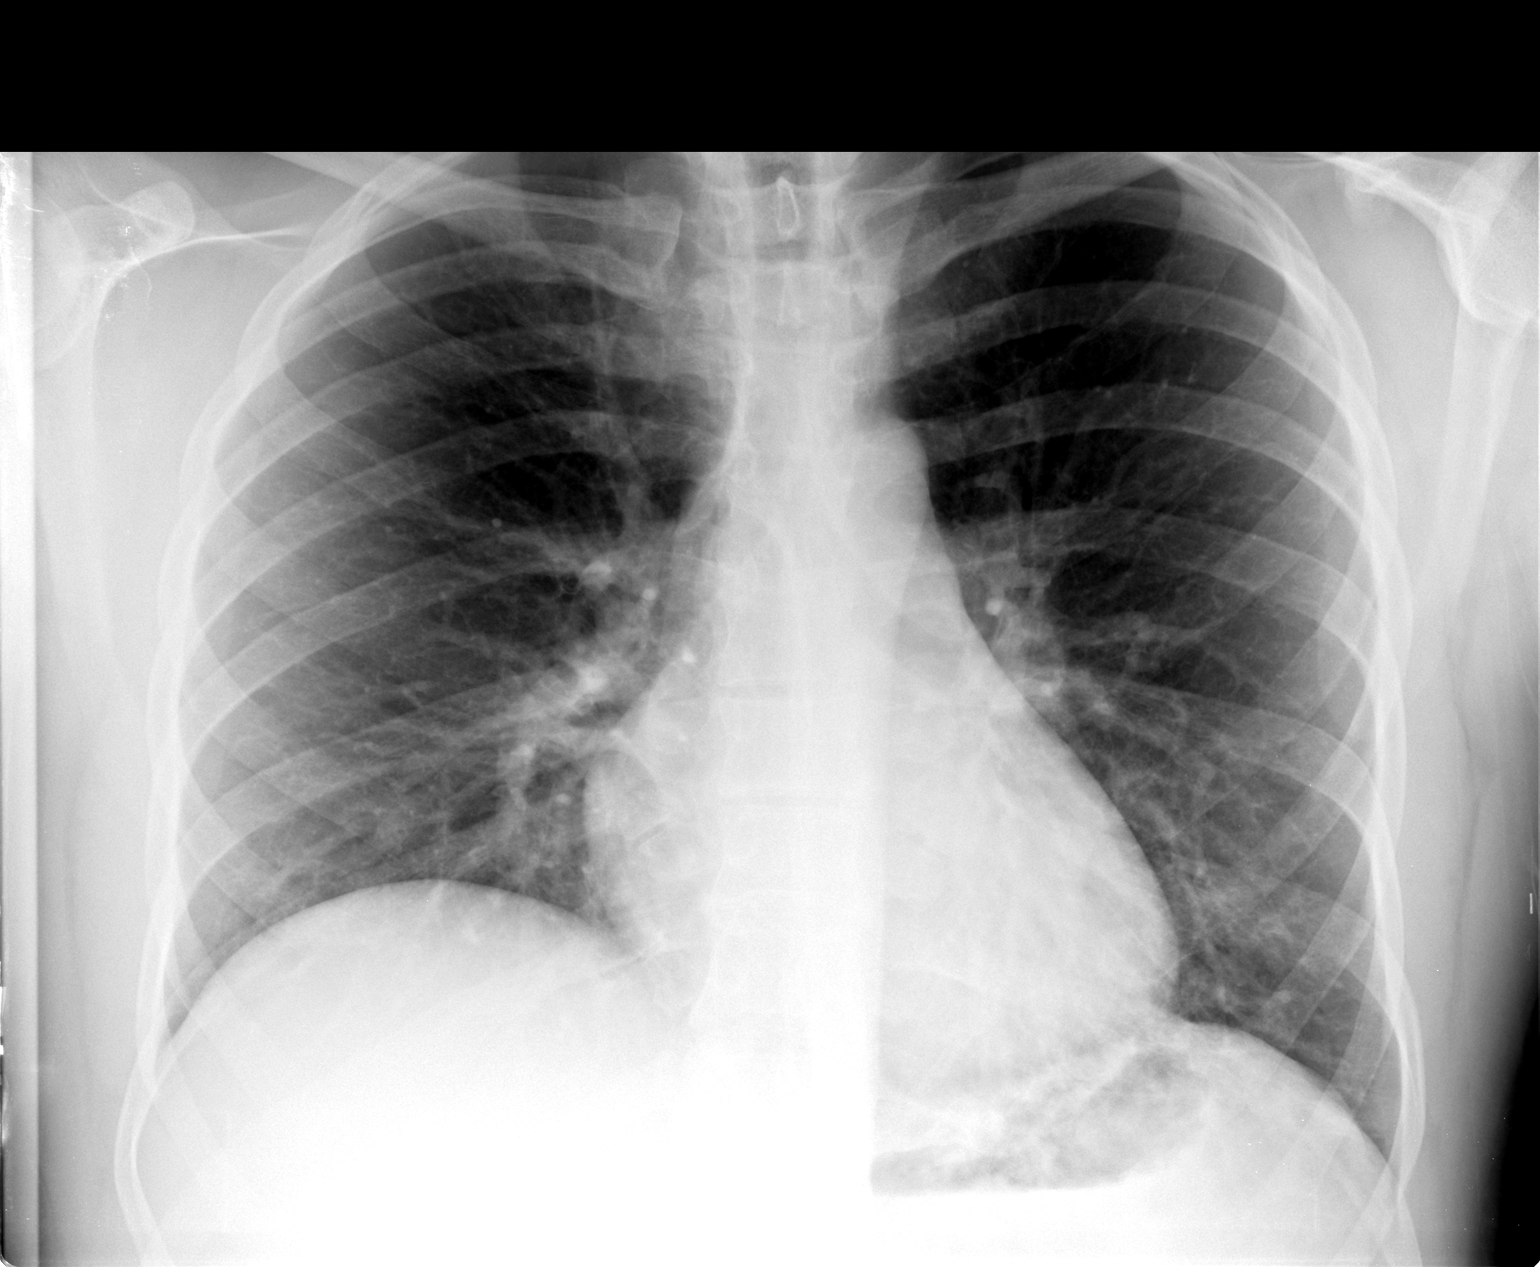

[view not recorded (2 of 2)]
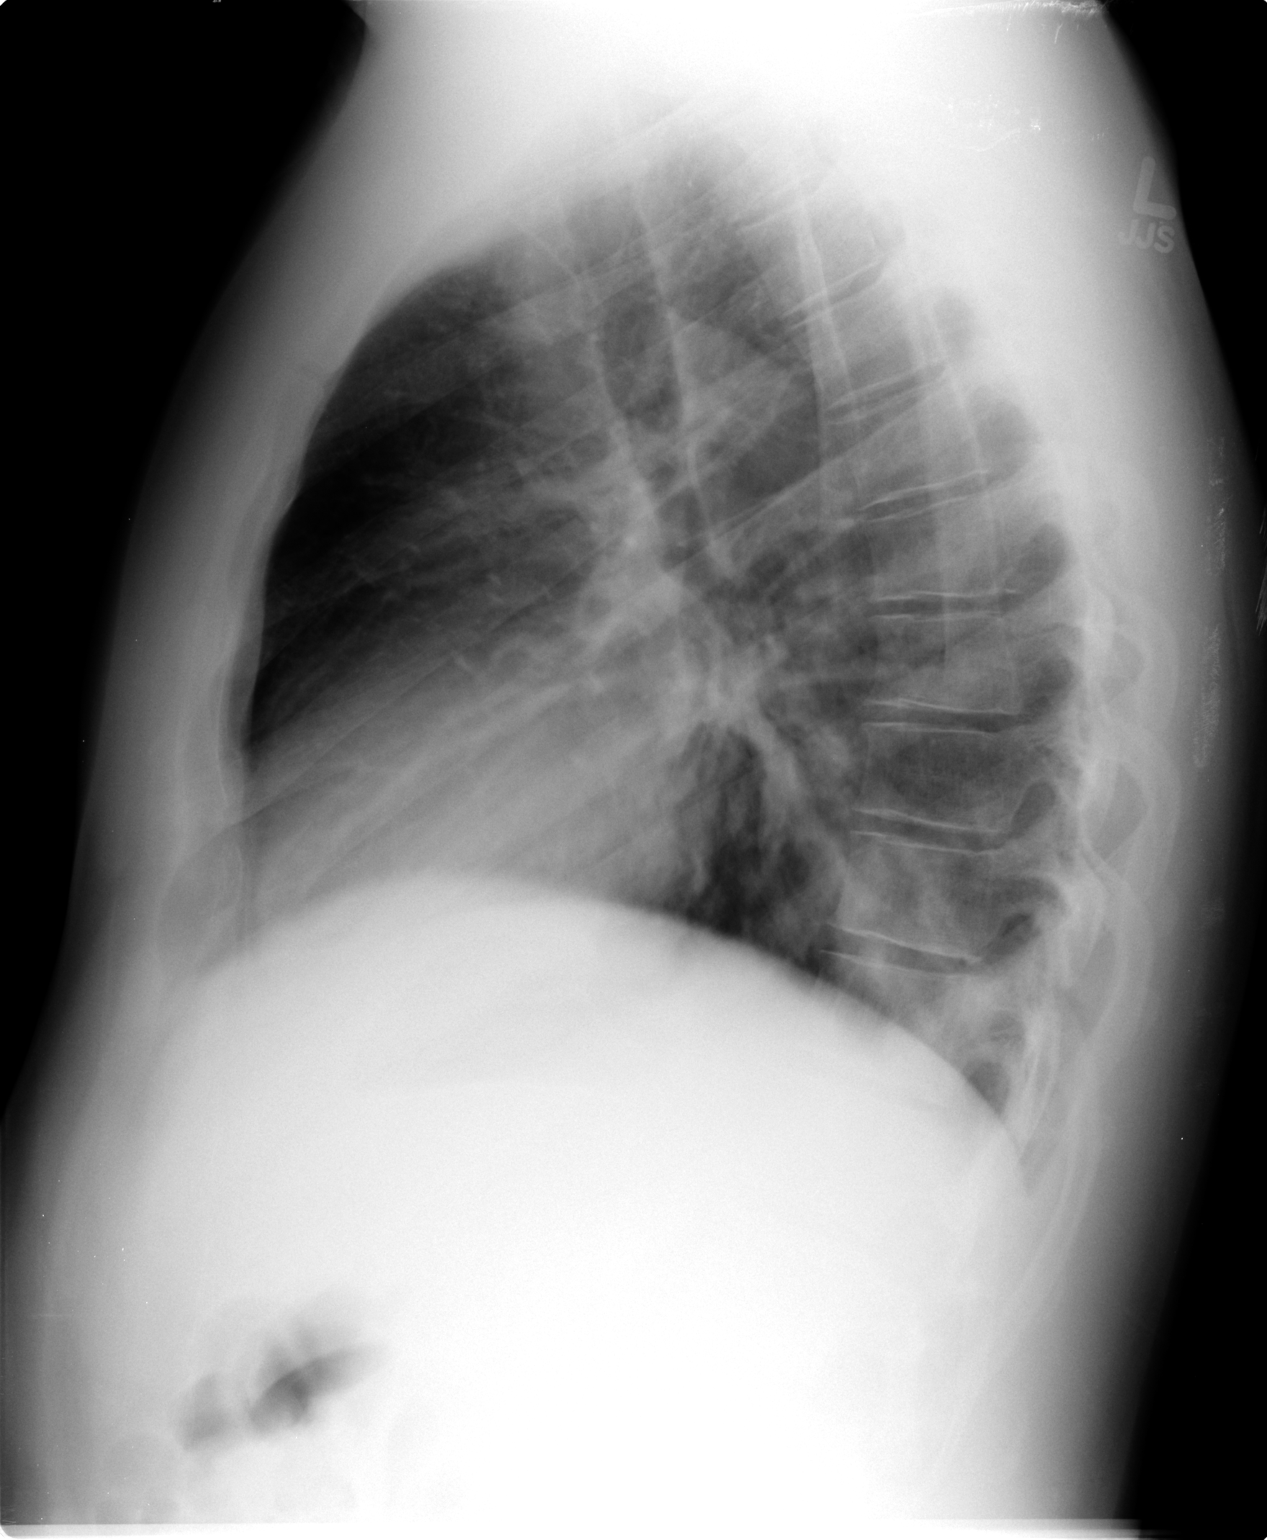

[2 of 2 positions shown; findings below may reference images not displayed]

FINDINGS: No confluent airspace opacity.  Heart is upper limits
normal in size.  Mild peribronchial thickening.  No effusions or
acute bony abnormality.
IMPRESSION: Mild bronchitic changes.

## 2014-08-14 ENCOUNTER — Other Ambulatory Visit (INDEPENDENT_AMBULATORY_CARE_PROVIDER_SITE_OTHER): Payer: Commercial Managed Care - PPO

## 2014-08-14 ENCOUNTER — Encounter: Payer: Self-pay | Admitting: Internal Medicine

## 2014-08-14 ENCOUNTER — Ambulatory Visit (INDEPENDENT_AMBULATORY_CARE_PROVIDER_SITE_OTHER): Payer: Commercial Managed Care - PPO | Admitting: Internal Medicine

## 2014-08-14 VITALS — BP 134/80 | HR 68 | Ht 76.0 in | Wt 232.0 lb

## 2014-08-14 DIAGNOSIS — Z0000 Encounter for general adult medical examination without abnormal findings: Secondary | ICD-10-CM | POA: Diagnosis not present

## 2014-08-14 LAB — CBC WITH DIFFERENTIAL/PLATELET
BASOS PCT: 0.7 % (ref 0.0–3.0)
Basophils Absolute: 0 10*3/uL (ref 0.0–0.1)
Eosinophils Absolute: 0.1 10*3/uL (ref 0.0–0.7)
Eosinophils Relative: 1.9 % (ref 0.0–5.0)
HEMATOCRIT: 45 % (ref 39.0–52.0)
Hemoglobin: 15.6 g/dL (ref 13.0–17.0)
LYMPHS ABS: 1.9 10*3/uL (ref 0.7–4.0)
Lymphocytes Relative: 35.1 % (ref 12.0–46.0)
MCHC: 34.5 g/dL (ref 30.0–36.0)
MCV: 88 fl (ref 78.0–100.0)
MONO ABS: 0.5 10*3/uL (ref 0.1–1.0)
Monocytes Relative: 8.7 % (ref 3.0–12.0)
Neutro Abs: 2.9 10*3/uL (ref 1.4–7.7)
Neutrophils Relative %: 53.6 % (ref 43.0–77.0)
PLATELETS: 166 10*3/uL (ref 150.0–400.0)
RBC: 5.12 Mil/uL (ref 4.22–5.81)
RDW: 12.8 % (ref 11.5–15.5)
WBC: 5.5 10*3/uL (ref 4.0–10.5)

## 2014-08-14 LAB — BASIC METABOLIC PANEL
BUN: 19 mg/dL (ref 6–23)
CALCIUM: 9.5 mg/dL (ref 8.4–10.5)
CHLORIDE: 102 meq/L (ref 96–112)
CO2: 29 meq/L (ref 19–32)
CREATININE: 0.92 mg/dL (ref 0.40–1.50)
GFR: 102.39 mL/min (ref >60.00)
Glucose, Bld: 100 mg/dL — ABNORMAL HIGH (ref 70–99)
Potassium: 3.9 mEq/L (ref 3.5–5.1)
Sodium: 138 mEq/L (ref 135–145)

## 2014-08-14 LAB — URINALYSIS
Bilirubin Urine: NEGATIVE
Hgb urine dipstick: NEGATIVE
Ketones, ur: NEGATIVE
LEUKOCYTES UA: NEGATIVE
NITRITE: NEGATIVE
PH: 5.5 (ref 5.0–8.0)
SPECIFIC GRAVITY, URINE: 1.015 (ref 1.000–1.030)
Total Protein, Urine: NEGATIVE
UROBILINOGEN UA: 0.2 (ref 0.0–1.0)
Urine Glucose: NEGATIVE

## 2014-08-14 LAB — HEPATIC FUNCTION PANEL
ALT: 12 U/L (ref 0–53)
AST: 13 U/L (ref 0–37)
Albumin: 4.7 g/dL (ref 3.5–5.2)
Alkaline Phosphatase: 43 U/L (ref 39–117)
BILIRUBIN DIRECT: 0.1 mg/dL (ref 0.0–0.3)
Total Bilirubin: 0.8 mg/dL (ref 0.2–1.2)
Total Protein: 7.8 g/dL (ref 6.0–8.3)

## 2014-08-14 LAB — TSH: TSH: 2.39 u[IU]/mL (ref 0.35–4.50)

## 2014-08-14 NOTE — Progress Notes (Signed)
Pre visit review using our clinic review tool, if applicable. No additional management support is needed unless otherwise documented below in the visit note. 

## 2014-08-14 NOTE — Assessment & Plan Note (Addendum)
Labs Vaccines Form - will fill out  We discussed age appropriate health related issues, including available/recomended screening tests and vaccinations. We discussed a need for adhering to healthy diet and exercise. Labs/EKG were reviewed/ordered. All questions were answered.

## 2014-08-18 NOTE — Progress Notes (Signed)
   Subjective:    Patient ID: Ralph Webster, male    DOB: [birth year], 28 y.o.   MRN: [id number]  HPI  The patient is here for a wellness exam. The patient has been doing well overall without major physical or psychological issues. 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  HENT: Negative for congestion, nosebleeds, sneezing, sore throat and trouble swallowing.   Eyes: Negative for itching and visual disturbance.  Respiratory: Negative for cough.   Cardiovascular: Negative for chest pain, palpitations and leg swelling.  Gastrointestinal: Negative for nausea, diarrhea, blood in stool and abdominal distention.  Genitourinary: Negative for frequency and hematuria.  Musculoskeletal: Negative for back pain, joint swelling, gait problem and neck pain.  Skin: Negative for rash.  Neurological: Negative for dizziness, tremors, speech difficulty and weakness.  Psychiatric/Behavioral: Negative for suicidal ideas, sleep disturbance, dysphoric mood and agitation. The patient is not nervous/anxious.        Objective:   Physical Exam  Constitutional: He is oriented to person, place, and time. He appears well-developed. No distress.  NAD  HENT:  Mouth/Throat: Oropharynx is clear and moist.  Eyes: Conjunctivae are normal. Pupils are equal, round, and reactive to light.  Neck: Normal range of motion. No JVD present. No thyromegaly present.  Cardiovascular: Normal rate, regular rhythm, normal heart sounds and intact distal pulses.  Exam reveals no gallop and no friction rub.   No murmur heard. Pulmonary/Chest: Effort normal and breath sounds normal. No respiratory distress. He has no wheezes. He has no rales. He exhibits no tenderness.  Abdominal: Soft. Bowel sounds are normal. He exhibits no distension and  no mass. There is no tenderness. There is no rebound and no guarding.  Musculoskeletal: Normal range of motion. He exhibits no edema or tenderness.  Lymphadenopathy:    He has no cervical adenopathy.  Neurological: He is alert and oriented to person, place, and time. He has normal reflexes. No cranial nerve deficit. He exhibits normal muscle tone. He displays a negative Romberg sign. Coordination and gait normal.  Skin: Skin is warm and dry. No rash noted.  Psychiatric: He has a normal mood and affect. His behavior is normal. Judgment and thought content normal.  testic exam is nl  Lab Results  Component Value Date   WBC 5.5 08/14/2014   HGB 15.6 08/14/2014   HCT 45.0 08/14/2014   PLT 166.0 08/14/2014   GLUCOSE 100* 08/14/2014   CHOL 179 08/08/2008   TRIG 115.0 08/08/2008   HDL 43.10 08/08/2008   LDLCALC 113* 08/08/2008   ALT 12 08/14/2014   AST 13 08/14/2014   NA 138 08/14/2014   K 3.9 08/14/2014   CL 102 08/14/2014   CREATININE 0.92 08/14/2014   BUN 19 08/14/2014   CO2 29 08/14/2014   TSH 2.39 08/14/2014           Assessment & Plan:

## 2014-08-19 ENCOUNTER — Telehealth: Payer: Self-pay | Admitting: Internal Medicine

## 2014-08-19 NOTE — Telephone Encounter (Signed)
Patient states he left some forms with Dr. Alain Marion last week on his OV.  He would like a call once complete.

## 2014-08-20 NOTE — Telephone Encounter (Signed)
Labs are not back yet Thx

## 2014-08-20 NOTE — Telephone Encounter (Signed)
Advised patient that lab results are not back yet, forms will be completed after we get results and we will call pt back

## 2014-08-22 ENCOUNTER — Other Ambulatory Visit: Payer: Self-pay | Admitting: *Deleted

## 2014-08-22 DIAGNOSIS — Z0000 Encounter for general adult medical examination without abnormal findings: Secondary | ICD-10-CM

## 2014-08-28 ENCOUNTER — Other Ambulatory Visit: Payer: Commercial Managed Care - PPO

## 2014-08-28 DIAGNOSIS — Z0000 Encounter for general adult medical examination without abnormal findings: Secondary | ICD-10-CM

## 2014-08-29 LAB — VARICELLA ZOSTER ANTIBODY, IGG: Varicella IgG: >4000 Index — ABNORMAL HIGH (ref <135.00)

## 2014-08-29 LAB — MEASLES/MUMPS/RUBELLA IMMUNITY
MUMPS IGG: 226 [AU]/ml — AB (ref <9.00)
Rubella: 2.14 Index — ABNORMAL HIGH (ref <0.90)
Rubeola IgG: 138 AU/mL — ABNORMAL HIGH (ref <25.00)

## 2014-08-29 LAB — HEPATITIS B SURFACE ANTIBODY,QUALITATIVE: HEP B S AB: NEGATIVE

## 2014-08-30 LAB — QUANTIFERON TB GOLD ASSAY (BLOOD)
Interferon Gamma Release Assay: NEGATIVE
QUANTIFERON NIL VALUE: 0.07 [IU]/mL
Quantiferon Tb Ag Minus Nil Value: 0 IU/mL
TB AG VALUE: 0.05 [IU]/mL

## 2014-09-04 NOTE — Telephone Encounter (Signed)
Patient states that another round of labs was done in order to complete his form.  He would like a call back in regards to if Dr. Camila Li has finished the form yet.

## 2014-09-05 NOTE — Telephone Encounter (Signed)
Pt informed. Nurse visit scheduled for Hep B booster. Form and copy of labs are upfront for p/u.   Notes Recorded by Cassandria Anger, MD on 09/01/2014 at 11:11 PM Erline Levine, please, inform patient that his TB test is negative. He immune to rubella, mumps, rubeola. He needs to have a booster for hepatitis B. Thx  Please, mail the labs to the patient.

## 2014-09-11 ENCOUNTER — Ambulatory Visit (INDEPENDENT_AMBULATORY_CARE_PROVIDER_SITE_OTHER): Payer: Commercial Managed Care - PPO

## 2014-09-11 DIAGNOSIS — Z23 Encounter for immunization: Secondary | ICD-10-CM | POA: Diagnosis not present

## 2015-05-19 ENCOUNTER — Other Ambulatory Visit: Payer: Commercial Managed Care - PPO

## 2015-05-19 ENCOUNTER — Ambulatory Visit (INDEPENDENT_AMBULATORY_CARE_PROVIDER_SITE_OTHER): Payer: Commercial Managed Care - PPO

## 2015-05-19 DIAGNOSIS — Z23 Encounter for immunization: Secondary | ICD-10-CM

## 2015-05-19 DIAGNOSIS — Z111 Encounter for screening for respiratory tuberculosis: Secondary | ICD-10-CM

## 2015-05-19 NOTE — Progress Notes (Signed)
Pre visit review using our clinic review tool, if applicable. No additional management support is needed unless otherwise documented below in the visit note. 

## 2015-05-21 LAB — QUANTIFERON TB GOLD ASSAY (BLOOD)
Interferon Gamma Release Assay: NEGATIVE
Mitogen value: >10 IU/mL
QUANTIFERON NIL VALUE: 0.05 [IU]/mL
QUANTIFERON TB AG MINUS NIL: 0 [IU]/mL
TB AG VALUE: 0.05 [IU]/mL

## 2015-10-20 ENCOUNTER — Ambulatory Visit (INDEPENDENT_AMBULATORY_CARE_PROVIDER_SITE_OTHER): Payer: Commercial Managed Care - PPO

## 2015-10-20 DIAGNOSIS — Z23 Encounter for immunization: Secondary | ICD-10-CM

## 2015-10-29 ENCOUNTER — Other Ambulatory Visit (INDEPENDENT_AMBULATORY_CARE_PROVIDER_SITE_OTHER): Payer: Commercial Managed Care - PPO

## 2015-10-29 ENCOUNTER — Encounter: Payer: Self-pay | Admitting: Internal Medicine

## 2015-10-29 ENCOUNTER — Ambulatory Visit (INDEPENDENT_AMBULATORY_CARE_PROVIDER_SITE_OTHER): Payer: Commercial Managed Care - PPO | Admitting: Internal Medicine

## 2015-10-29 VITALS — BP 120/76 | HR 82 | Ht 76.0 in | Wt 232.0 lb

## 2015-10-29 DIAGNOSIS — Z0000 Encounter for general adult medical examination without abnormal findings: Secondary | ICD-10-CM | POA: Diagnosis not present

## 2015-10-29 LAB — CBC WITH DIFFERENTIAL/PLATELET
Basophils Absolute: 0 10*3/uL (ref 0.0–0.1)
Basophils Relative: 0.7 % (ref 0.0–3.0)
EOS PCT: 2.4 % (ref 0.0–5.0)
Eosinophils Absolute: 0.1 10*3/uL (ref 0.0–0.7)
HCT: 46.1 % (ref 39.0–52.0)
Hemoglobin: 15.9 g/dL (ref 13.0–17.0)
LYMPHS ABS: 1.4 10*3/uL (ref 0.7–4.0)
Lymphocytes Relative: 26 % (ref 12.0–46.0)
MCHC: 34.5 g/dL (ref 30.0–36.0)
MCV: 87.8 fl (ref 78.0–100.0)
MONO ABS: 0.3 10*3/uL (ref 0.1–1.0)
MONOS PCT: 5.5 % (ref 3.0–12.0)
NEUTROS ABS: 3.5 10*3/uL (ref 1.4–7.7)
NEUTROS PCT: 65.4 % (ref 43.0–77.0)
PLATELETS: 169 10*3/uL (ref 150.0–400.0)
RBC: 5.24 Mil/uL (ref 4.22–5.81)
RDW: 12.6 % (ref 11.5–15.5)
WBC: 5.4 10*3/uL (ref 4.0–10.5)

## 2015-10-29 LAB — URINALYSIS
BILIRUBIN URINE: NEGATIVE
Hgb urine dipstick: NEGATIVE
Ketones, ur: NEGATIVE
Leukocytes, UA: NEGATIVE
NITRITE: NEGATIVE
PH: 7.5 (ref 5.0–8.0)
Specific Gravity, Urine: 1.01 (ref 1.000–1.030)
TOTAL PROTEIN, URINE-UPE24: NEGATIVE
Urine Glucose: NEGATIVE
Urobilinogen, UA: 0.2 (ref 0.0–1.0)

## 2015-10-29 LAB — HEPATITIS B SURFACE ANTIBODY,QUALITATIVE: Hep B S Ab: NEGATIVE

## 2015-10-29 LAB — BASIC METABOLIC PANEL
BUN: 18 mg/dL (ref 6–23)
CALCIUM: 9.9 mg/dL (ref 8.4–10.5)
CO2: 31 meq/L (ref 19–32)
Chloride: 102 mEq/L (ref 96–112)
Creatinine, Ser: 0.84 mg/dL (ref 0.40–1.50)
GFR: 112.83 mL/min (ref >60.00)
GLUCOSE: 92 mg/dL (ref 70–99)
Potassium: 4.3 mEq/L (ref 3.5–5.1)
SODIUM: 139 meq/L (ref 135–145)

## 2015-10-29 NOTE — Progress Notes (Signed)
Pre visit review using our clinic review tool, if applicable. No additional management support is needed unless otherwise documented below in the visit note. 

## 2015-10-29 NOTE — Progress Notes (Signed)
Subjective:  Patient ID: Ralph Webster, male    DOB: Apr 26, 1986  Age: 30 y.o. MRN: [id number]  CC: Annual Exam   HPI ARVILLE HEXT presents for well exam  No outpatient prescriptions prior to visit.   No facility-administered medications prior to visit.    ROS Review of Systems  Constitutional: Negative for appetite change, fatigue and unexpected weight change.  HENT: Negative for congestion, nosebleeds, sneezing, sore throat and trouble swallowing.   Eyes: Negative for itching and visual disturbance.  Respiratory: Negative for cough and shortness of breath.   Cardiovascular: Negative for chest pain, palpitations and leg swelling.  Gastrointestinal: Negative for nausea, diarrhea, blood in stool and abdominal distention.  Genitourinary: Negative for frequency and hematuria.  Musculoskeletal: Negative for back pain, joint swelling, gait problem and neck pain.  Skin: Negative for rash.  Neurological: Negative for dizziness, tremors, speech difficulty and weakness.  Psychiatric/Behavioral: Negative for suicidal ideas, sleep disturbance, dysphoric mood and agitation. The patient is not nervous/anxious.     Objective:  BP 120/76 mmHg  Pulse 82  Ht 6\' 4"  (1.93 m)  Wt 232 lb (105.235 kg)  BMI 28.25 kg/m2  SpO2 98%  BP Readings from Last 3 Encounters:  10/29/15 120/76  08/14/14 134/80  01/02/14 120/70    Wt Readings from Last 3 Encounters:  10/29/15 232 lb (105.235 kg)  08/14/14 232 lb (105.235 kg)  01/02/14 232 lb 8 oz (105.461 kg)    Physical Exam  Constitutional: He is oriented to person, place, and time. He appears well-developed. No distress.  NAD  HENT:  Mouth/Throat: Oropharynx is clear and moist.  Eyes: Conjunctivae are normal. Pupils are equal, round, and reactive to light.  Neck: Normal range of motion. No JVD present. No thyromegaly present.  Cardiovascular: Normal rate, regular rhythm, normal heart sounds and intact distal pulses.  Exam reveals no gallop  and no friction rub.   No murmur heard. Pulmonary/Chest: Effort normal and breath sounds normal. No respiratory distress. He has no wheezes. He has no rales. He exhibits no tenderness.  Abdominal: Soft. Bowel sounds are normal. He exhibits no distension and no mass. There is no tenderness. There is no rebound and no guarding.  Musculoskeletal: Normal range of motion. He exhibits no edema or tenderness.  Lymphadenopathy:    He has no cervical adenopathy.  Neurological: He is alert and oriented to person, place, and time. He has normal reflexes. No cranial nerve deficit. He exhibits normal muscle tone. He displays a negative Romberg sign. Coordination and gait normal.  Skin: Skin is warm and dry. No rash noted.  Psychiatric: He has a normal mood and affect. His behavior is normal. Judgment and thought content normal.    Lab Results  Component Value Date   WBC 5.5 08/14/2014   HGB 15.6 08/14/2014   HCT 45.0 08/14/2014   PLT 166.0 08/14/2014   GLUCOSE 100* 08/14/2014   CHOL 179 08/08/2008   TRIG 115.0 08/08/2008   HDL 43.10 08/08/2008   LDLCALC 113* 08/08/2008   ALT 12 08/14/2014   AST 13 08/14/2014   NA 138 08/14/2014   K 3.9 08/14/2014   CL 102 08/14/2014   CREATININE 0.92 08/14/2014   BUN 19 08/14/2014   CO2 29 08/14/2014   TSH 2.39 08/14/2014    Dg Chest 2 View  05/19/2012  *RADIOLOGY REPORT* Clinical Data: Fever, cough. CHEST - 2 VIEW Comparison: 06/20/2009 Findings: No confluent airspace opacity.  Heart is upper limits normal in size.  Mild  peribronchial thickening.  No effusions or acute bony abnormality. IMPRESSION: Mild bronchitic changes. Original Report Authenticated By: Rolm Baptise, M.D.    Assessment & Plan:   Angelito was seen today for annual exam.  Diagnoses and all orders for this visit:  Well adult exam -     Measles/Mumps/Rubella Immunity -     Varicella zoster antibody, IgG -     Hepatitis B surface antibody -     Quantiferon tb gold assay -      Urinalysis; Future -     CBC with Differential/Platelet; Future -     Basic metabolic panel; Future   Mr. Boakye does not currently have medications on file.  No orders of the defined types were placed in this encounter.     Follow-up: Return in about 1 year (around 10/28/2016) for Wellness Exam.  Walker Kehr, MD

## 2015-10-29 NOTE — Assessment & Plan Note (Signed)
Labs

## 2015-10-30 LAB — VARICELLA ZOSTER ANTIBODY, IGG: Varicella IgG: >4000 Index — ABNORMAL HIGH (ref <135.00)

## 2015-10-30 LAB — MEASLES/MUMPS/RUBELLA IMMUNITY
Mumps IgG: 200 AU/mL — ABNORMAL HIGH (ref <9.00)
Rubella: 2.54 Index — ABNORMAL HIGH (ref <0.90)
Rubeola IgG: 229 AU/mL — ABNORMAL HIGH (ref <25.00)

## 2015-10-31 LAB — QUANTIFERON TB GOLD ASSAY (BLOOD)
INTERFERON GAMMA RELEASE ASSAY: NEGATIVE
Mitogen-Nil: >10 IU/mL
QUANTIFERON NIL VALUE: 0.04 [IU]/mL
QUANTIFERON TB AG MINUS NIL: 0.02 [IU]/mL

## 2015-11-05 ENCOUNTER — Ambulatory Visit (INDEPENDENT_AMBULATORY_CARE_PROVIDER_SITE_OTHER): Payer: Commercial Managed Care - PPO

## 2015-11-05 DIAGNOSIS — Z23 Encounter for immunization: Secondary | ICD-10-CM | POA: Diagnosis not present

## 2016-03-08 ENCOUNTER — Ambulatory Visit (INDEPENDENT_AMBULATORY_CARE_PROVIDER_SITE_OTHER): Payer: Commercial Managed Care - PPO | Admitting: Internal Medicine

## 2016-03-08 ENCOUNTER — Encounter: Payer: Self-pay | Admitting: Internal Medicine

## 2016-03-08 DIAGNOSIS — H60332 Swimmer's ear, left ear: Secondary | ICD-10-CM | POA: Diagnosis not present

## 2016-03-08 DIAGNOSIS — H6092 Unspecified otitis externa, left ear: Secondary | ICD-10-CM | POA: Insufficient documentation

## 2016-03-08 MED ORDER — CIPROFLOXACIN HCL 500 MG PO TABS: 500.0000 mg | ORAL_TABLET | Freq: Two times a day (BID) | ORAL | 0 refills | Status: AC

## 2016-03-08 NOTE — Progress Notes (Signed)
Pre visit review using our clinic review tool, if applicable. No additional management support is needed unless otherwise documented below in the visit note. 

## 2016-03-08 NOTE — Progress Notes (Signed)
   Subjective:    Patient ID: Ralph Webster, male    DOB: October 06, 1985, 30 y.o.   MRN: [id number]  HPI  Here with 3 days onset sharp constant moderate left ear pain, swelling, d/c and tenderness , with HA, fever, and pain radaiting to jaw ibuprofen helps somewhat for pain, nothing else makes better or worse except worse to chew. Has been seen recently given ciprodex but now not working.   Past Medical History:  Diagnosis Date  . Migraine headache    No past surgical history on file.  reports that he has never smoked. He has never used smokeless tobacco. He reports that he does not drink alcohol or use drugs. family history is not on file. Allergies  Allergen Reactions  . Sulfa Antibiotics Rash    No anaphylaxis   No current outpatient prescriptions on file prior to visit.   No current facility-administered medications on file prior to visit.      Review of Systems All otherwise neg per pt     Objective:   Physical Exam BP 132/76   Pulse 86   Temp 98.8 F (37.1 C) (Oral)   Resp 20   Wt 234 lb (106.1 kg)   SpO2 98%   BMI 28.48 kg/m  VS noted,  Constitutional: Pt appears in no apparent distress HENT: Head: NCAT.  Right Ear: External ear normal.  Left Ear: External ear normal. Left canal with 2-3+ red/tender/sweling and mucous d/c  Eyes: . Pupils are equal, round, and reactive to light. Conjunctivae and EOM are normal Neck: Normal range of motion. Neck supple.  Cardiovascular: Normal rate and regular rhythm.   Pulmonary/Chest: Effort normal and breath sounds without rales or wheezing.  Neurological: Pt is alert. Not confused , motor grossly intact Skin: Skin is warm. No rash, no LE edema Psychiatric: Pt behavior is normal. No agitation.     Assessment & Plan:

## 2016-03-08 NOTE — Patient Instructions (Signed)
Please take all new medication as prescribed - the pill antibiotics  Please continue all other medications as before, including the ciprodex  Please have the pharmacy call with any other refills you may need.  Please keep your appointments with your specialists as you may have planned  Please call for ENT referral if not better in 2 days

## 2016-03-15 NOTE — Assessment & Plan Note (Signed)
Worsening despite topical tx, for oral cipro course,  to f/u any worsening symptoms or concerns

## 2016-09-21 ENCOUNTER — Encounter: Payer: Self-pay | Admitting: Internal Medicine

## 2016-09-21 ENCOUNTER — Ambulatory Visit (INDEPENDENT_AMBULATORY_CARE_PROVIDER_SITE_OTHER): Payer: Commercial Managed Care - PPO | Admitting: Internal Medicine

## 2016-09-21 DIAGNOSIS — H6591 Unspecified nonsuppurative otitis media, right ear: Secondary | ICD-10-CM | POA: Diagnosis not present

## 2016-09-21 MED ORDER — AMOXICILLIN-POT CLAVULANATE 875-125 MG PO TABS: 1.0000 | ORAL_TABLET | Freq: Two times a day (BID) | ORAL | 0 refills | Status: DC

## 2016-09-21 MED ORDER — NEOMYCIN-POLYMYXIN-HC 3.5-10000-1 OT SOLN: 3.0000 [drp] | Freq: Three times a day (TID) | OTIC | 0 refills | Status: DC

## 2016-09-21 NOTE — Progress Notes (Signed)
   Subjective:    Patient ID: Ralph Webster, male    DOB: 08-28-1985, 31 y.o.   MRN: [id number]  HPI The patient is a 31 YO man coming in for ear pain and possible infection. He has had several infections lately. Fevers the last 1-2 days and taking tylenol for that. He denies changes. No real sinus problems. Pain with pulling on the ear. Less hearing from the right ear. Left ear is fairly normal. No sore throat. Mild cough and mild drainage but not severe.   Review of Systems  Constitutional: Positive for activity change and fever. Negative for appetite change, chills and unexpected weight change.  HENT: Positive for congestion, ear discharge, ear pain and hearing loss. Negative for postnasal drip, rhinorrhea, sinus pain, sinus pressure, sore throat, tinnitus and trouble swallowing.   Eyes: Negative.   Respiratory: Positive for cough. Negative for chest tightness, shortness of breath and wheezing.   Cardiovascular: Negative.   Gastrointestinal: Negative.   Neurological: Negative.       Objective:   Physical Exam  Constitutional: He appears well-developed and well-nourished.  HENT:  Head: Normocephalic and atraumatic.  Nose: Nose normal.  Left TM normal, right pain with pulling and TM with increased fluid and cloudy effusion  Eyes: EOM are normal.  Neck: Normal range of motion.  Cardiovascular: Normal rate and regular rhythm.   Pulmonary/Chest: Effort normal and breath sounds normal.  Abdominal: Soft.  Skin: Skin is warm and dry.   Vitals:   09/21/16 1415  BP: 134/90  Pulse: 89  Resp: 12  Temp: 98.4 F (36.9 C)  TempSrc: Oral  SpO2: 98%  Weight: 241 lb (109.3 kg)  Height: 6\' 4"  (1.93 m)      Assessment & Plan:

## 2016-09-21 NOTE — Assessment & Plan Note (Signed)
Rx for augmentin and cortisporin ear drops for dual therapy given the recurrence. If no improvement return or call.

## 2016-09-21 NOTE — Patient Instructions (Signed)
We have sent in the augmentin for the ear infection. Take 1 pill twice a day for 10 days.   We have also sent in the ear drops cortisporin, use 3 drops in the each 3 times per day for 5 days to help clear this.

## 2017-01-25 ENCOUNTER — Encounter: Payer: Self-pay | Admitting: Nurse Practitioner

## 2017-01-25 ENCOUNTER — Ambulatory Visit (INDEPENDENT_AMBULATORY_CARE_PROVIDER_SITE_OTHER): Payer: PRIVATE HEALTH INSURANCE | Admitting: Nurse Practitioner

## 2017-01-25 VITALS — BP 124/84 | HR 88 | Temp 97.7°F | Ht 76.0 in | Wt 255.0 lb

## 2017-01-25 DIAGNOSIS — J209 Acute bronchitis, unspecified: Secondary | ICD-10-CM | POA: Diagnosis not present

## 2017-01-25 DIAGNOSIS — J0140 Acute pansinusitis, unspecified: Secondary | ICD-10-CM

## 2017-01-25 MED ORDER — HYDROCODONE-HOMATROPINE 5-1.5 MG/5ML PO SYRP: 5.0000 mL | ORAL_SOLUTION | Freq: Four times a day (QID) | ORAL | 0 refills | Status: DC | PRN

## 2017-01-25 MED ORDER — ALBUTEROL SULFATE HFA 108 (90 BASE) MCG/ACT IN AERS: 1.0000 | INHALATION_SPRAY | Freq: Four times a day (QID) | RESPIRATORY_TRACT | 0 refills | Status: DC | PRN

## 2017-01-25 MED ORDER — AMOXICILLIN-POT CLAVULANATE 875-125 MG PO TABS: 1.0000 | ORAL_TABLET | Freq: Two times a day (BID) | ORAL | 0 refills | Status: DC

## 2017-01-25 MED ORDER — GUAIFENESIN-DM 100-10 MG/5ML PO SYRP: 10.0000 mL | ORAL_SOLUTION | Freq: Three times a day (TID) | ORAL | 0 refills | Status: DC | PRN

## 2017-01-25 MED ORDER — IPRATROPIUM BROMIDE 0.03 % NA SOLN: 2.0000 | Freq: Two times a day (BID) | NASAL | 0 refills | Status: DC

## 2017-01-25 NOTE — Patient Instructions (Signed)
URI Instructions: Encourage adequate oral hydration.  Use over-the-counter  "cold" medicines  such as "Tylenol cold" , "Advil cold",  "Mucinex" or" Mucinex D"  for cough and congestion.  Avoid decongestants if you have high blood pressure. Use" Delsym" or" Robitussin" cough syrup varietis for cough.  You can use plain "Tylenol" or "Advi"l for fever, chills and achyness.  call office if no improvement in 3-5days. Consider CXR if no improvement

## 2017-01-25 NOTE — Progress Notes (Signed)
Subjective:  Patient ID: Ralph Webster, male    DOB: Sep 25, 1985  Age: 31 y.o. MRN: [id number]  CC: Cough (coughing,throat tingling--going on for 1 wk. )   Cough  This is a new problem. The current episode started in the past 7 days. The problem has been waxing and waning. The problem occurs constantly. The cough is productive of sputum. Associated symptoms include chills, a fever, nasal congestion, postnasal drip, rhinorrhea and shortness of breath. Pertinent negatives include no chest pain or wheezing. The symptoms are aggravated by lying down. Risk factors for lung disease include occupational exposure. He has tried OTC cough suppressant for the symptoms. The treatment provided no relief.    Outpatient Medications Prior to Visit  Medication Sig Dispense Refill  . amoxicillin-clavulanate (AUGMENTIN) 875-125 MG tablet Take 1 tablet by mouth 2 (two) times daily. (Patient not taking: Reported on 01/25/2017) 20 tablet 0  . neomycin-polymyxin-hydrocortisone (CORTISPORIN) otic solution Place 3 drops into the right ear 3 (three) times daily. (Patient not taking: Reported on 01/25/2017) 10 mL 0   No facility-administered medications prior to visit.     ROS See HPI  Objective:  BP 124/84   Pulse 88   Temp 97.7 F (36.5 C)   Ht 6\' 4"  (1.93 m)   Wt 255 lb (115.7 kg)   SpO2 98%   BMI 31.04 kg/m   BP Readings from Last 3 Encounters:  01/25/17 124/84  09/21/16 134/90  03/08/16 132/76    Wt Readings from Last 3 Encounters:  01/25/17 255 lb (115.7 kg)  09/21/16 241 lb (109.3 kg)  03/08/16 234 lb (106.1 kg)    Physical Exam  Constitutional: He is oriented to person, place, and time. No distress.  HENT:  Right Ear: Tympanic membrane, external ear and ear canal normal.  Left Ear: Tympanic membrane and ear canal normal.  Nose: Mucosal edema and rhinorrhea present. Right sinus exhibits no maxillary sinus tenderness and no frontal sinus tenderness. Left sinus exhibits no maxillary sinus  tenderness and no frontal sinus tenderness.  Mouth/Throat: Uvula is midline. Posterior oropharyngeal erythema present. No oropharyngeal exudate.  Eyes: No scleral icterus.  Neck: Normal range of motion. Neck supple.  Cardiovascular: Normal rate and regular rhythm.   Pulmonary/Chest: Effort normal and breath sounds normal. No respiratory distress. He has no wheezes. He has no rales.  Lymphadenopathy:    He has no cervical adenopathy.  Neurological: He is alert and oriented to person, place, and time.  Skin: He is diaphoretic.  Psychiatric: He has a normal mood and affect. His behavior is normal.  Vitals reviewed.   Lab Results  Component Value Date   WBC 5.4 10/29/2015   HGB 15.9 10/29/2015   HCT 46.1 10/29/2015   PLT 169.0 10/29/2015   GLUCOSE 92 10/29/2015   CHOL 179 08/08/2008   TRIG 115.0 08/08/2008   HDL 43.10 08/08/2008   LDLCALC 113 (H) 08/08/2008   ALT 12 08/14/2014   AST 13 08/14/2014   NA 139 10/29/2015   K 4.3 10/29/2015   CL 102 10/29/2015   CREATININE 0.84 10/29/2015   BUN 18 10/29/2015   CO2 31 10/29/2015   TSH 2.39 08/14/2014    Dg Chest 2 View  Result Date: 05/19/2012 *RADIOLOGY REPORT* Clinical Data: Fever, cough. CHEST - 2 VIEW Comparison: 06/20/2009 Findings: No confluent airspace opacity.  Heart is upper limits normal in size.  Mild peribronchial thickening.  No effusions or acute bony abnormality. IMPRESSION: Mild bronchitic changes. Original Report Authenticated By: Lennette Bihari  Ardeen Garland, M.D.    Assessment & Plan:   Ralph Webster was seen today for cough.  Diagnoses and all orders for this visit:  Acute bronchitis, unspecified organism -     albuterol (PROVENTIL HFA;VENTOLIN HFA) 108 (90 Base) MCG/ACT inhaler; Inhale 1-2 puffs into the lungs every 6 (six) hours as needed for wheezing or shortness of breath. -     HYDROcodone-homatropine (HYCODAN) 5-1.5 MG/5ML syrup; Take 5 mLs by mouth every 6 (six) hours as needed for cough. -     guaiFENesin-dextromethorphan  (ROBITUSSIN DM) 100-10 MG/5ML syrup; Take 10 mLs by mouth every 8 (eight) hours as needed for cough. -     amoxicillin-clavulanate (AUGMENTIN) 875-125 MG tablet; Take 1 tablet by mouth 2 (two) times daily. -     ipratropium (ATROVENT) 0.03 % nasal spray; Place 2 sprays into both nostrils 2 (two) times daily. Do not use for more than 5days.  Acute non-recurrent pansinusitis -     albuterol (PROVENTIL HFA;VENTOLIN HFA) 108 (90 Base) MCG/ACT inhaler; Inhale 1-2 puffs into the lungs every 6 (six) hours as needed for wheezing or shortness of breath. -     HYDROcodone-homatropine (HYCODAN) 5-1.5 MG/5ML syrup; Take 5 mLs by mouth every 6 (six) hours as needed for cough. -     guaiFENesin-dextromethorphan (ROBITUSSIN DM) 100-10 MG/5ML syrup; Take 10 mLs by mouth every 8 (eight) hours as needed for cough. -     amoxicillin-clavulanate (AUGMENTIN) 875-125 MG tablet; Take 1 tablet by mouth 2 (two) times daily. -     ipratropium (ATROVENT) 0.03 % nasal spray; Place 2 sprays into both nostrils 2 (two) times daily. Do not use for more than 5days.   I have discontinued Ralph Webster's neomycin-polymyxin-hydrocortisone and amoxicillin-clavulanate. I am also having him start on albuterol, HYDROcodone-homatropine, guaiFENesin-dextromethorphan, amoxicillin-clavulanate, and ipratropium.  Meds ordered this encounter  Medications  . albuterol (PROVENTIL HFA;VENTOLIN HFA) 108 (90 Base) MCG/ACT inhaler    Sig: Inhale 1-2 puffs into the lungs every 6 (six) hours as needed for wheezing or shortness of breath.    Dispense:  1 Inhaler    Refill:  0    Order Specific Question:   Supervising Provider    Answer:   Cassandria Anger [[id number]]  . HYDROcodone-homatropine (HYCODAN) 5-1.5 MG/5ML syrup    Sig: Take 5 mLs by mouth every 6 (six) hours as needed for cough.    Dispense:  120 mL    Refill:  0    Order Specific Question:   Supervising Provider    Answer:   Cassandria Anger [[id number]]  . guaiFENesin-dextromethorphan  (ROBITUSSIN DM) 100-10 MG/5ML syrup    Sig: Take 10 mLs by mouth every 8 (eight) hours as needed for cough.    Dispense:  118 mL    Refill:  0    Order Specific Question:   Supervising Provider    Answer:   Cassandria Anger [[id number]]  . amoxicillin-clavulanate (AUGMENTIN) 875-125 MG tablet    Sig: Take 1 tablet by mouth 2 (two) times daily.    Dispense:  14 tablet    Refill:  0    Order Specific Question:   Supervising Provider    Answer:   Cassandria Anger [[id number]]  . ipratropium (ATROVENT) 0.03 % nasal spray    Sig: Place 2 sprays into both nostrils 2 (two) times daily. Do not use for more than 5days.    Dispense:  30 mL    Refill:  0    Order Specific Question:  Supervising Provider    Answer:   Cassandria Anger [[id number]]    Follow-up: Return if symptoms worsen or fail to improve.  Wilfred Lacy, NP

## 2017-04-16 ENCOUNTER — Encounter (INDEPENDENT_AMBULATORY_CARE_PROVIDER_SITE_OTHER): Payer: Self-pay

## 2017-04-16 ENCOUNTER — Ambulatory Visit (INDEPENDENT_AMBULATORY_CARE_PROVIDER_SITE_OTHER): Payer: PRIVATE HEALTH INSURANCE | Admitting: Physician Assistant

## 2017-04-16 ENCOUNTER — Telehealth: Payer: Self-pay | Admitting: Physician Assistant

## 2017-04-16 ENCOUNTER — Encounter: Payer: Self-pay | Admitting: Physician Assistant

## 2017-04-16 VITALS — BP 118/84 | HR 90 | Temp 98.8°F | Resp 14 | Wt 255.0 lb

## 2017-04-16 DIAGNOSIS — H60392 Other infective otitis externa, left ear: Secondary | ICD-10-CM | POA: Diagnosis not present

## 2017-04-16 DIAGNOSIS — J069 Acute upper respiratory infection, unspecified: Secondary | ICD-10-CM

## 2017-04-16 DIAGNOSIS — B354 Tinea corporis: Secondary | ICD-10-CM | POA: Diagnosis not present

## 2017-04-16 DIAGNOSIS — B9789 Other viral agents as the cause of diseases classified elsewhere: Secondary | ICD-10-CM

## 2017-04-16 MED ORDER — NAFTIFINE HCL 1 % EX CREA: TOPICAL_CREAM | Freq: Every day | CUTANEOUS | 0 refills | Status: DC

## 2017-04-16 MED ORDER — BENZONATATE 100 MG PO CAPS: 100.0000 mg | ORAL_CAPSULE | Freq: Two times a day (BID) | ORAL | 0 refills | Status: DC | PRN

## 2017-04-16 MED ORDER — HYDROCORTISONE-ACETIC ACID 1-2 % OT SOLN: OTIC | 0 refills | Status: DC

## 2017-04-16 NOTE — Telephone Encounter (Signed)
Advised patient the rx for the antifungal medication was sent to the pharmacy. He states he just picked up something OTC.  He also stated that it was difficult finding a pharmacy with the ear drops. He was able to get [facility] on [address]. He will pick it up on Tuesday. He wants to finish up the last of the previous ear drops.

## 2017-04-16 NOTE — Telephone Encounter (Signed)
Pt called about wanting to get the antifungal cream Rx he said he will pay out of pocket. Pt is at pharmacy now.  Pharmacy is [facility], [place]  Call pt @ [phone]. Thank you!

## 2017-04-16 NOTE — Telephone Encounter (Signed)
Rx sent 

## 2017-04-16 NOTE — Patient Instructions (Signed)
Please stay well-hydrated and get plenty of rest. Use saline nasal rinse to flush out nasal passages. Use the Tessalon as directed for cough.  Please use the Vosol drops as directed. Wear cotton balls in the ear when showering. Follow-up with PCP or your ENT if not resolving.  Apply the Lotrimin twice daily as directed.

## 2017-04-16 NOTE — Progress Notes (Signed)
   Patient presents to clinic today c/o 2.5 days of chills, fatigue, dry cough with PND, and L ear pressure/pain. Patient denies known sick contact or recent travel. Has had flu shot this year. Has taken Sudafed to help with pressure and current symptoms.  Patient has also noted a ring-like lesion of upper and lower extremities. States girlfriend had ring worm recently that she acquired after taking in a stray kitten. He has started applying Lotrimin to the area with improvement.  Past Medical History:  Diagnosis Date  . Migraine headache     No current outpatient medications on file prior to visit.   No current facility-administered medications on file prior to visit.     Allergies  Allergen Reactions  . Sulfa Antibiotics Rash    No anaphylaxis    History reviewed. No pertinent family history.  Social History   Socioeconomic History  . Marital status: Single    Spouse name: None  . Number of children: None  . Years of education: None  . Highest education level: None  Social Needs  . Financial resource strain: None  . Food insecurity - worry: None  . Food insecurity - inability: None  . Transportation needs - medical: None  . Transportation needs - non-medical: None  Occupational History  . None  Tobacco Use  . Smoking status: Never Smoker  . Smokeless tobacco: Never Used  Substance and Sexual Activity  . Alcohol use: No  . Drug use: No  . Sexual activity: Not Currently  Other Topics Concern  . None  Social History Narrative  . None   Review of Systems - See HPI.  All other ROS are negative.  BP 118/84   Pulse 90   Temp 98.8 F (37.1 C) (Oral)   Resp 14   Wt 255 lb (115.7 kg)   SpO2 98%   BMI 31.04 kg/m   Physical Exam  Constitutional: He is oriented to person, place, and time and well-developed, well-nourished, and in no distress.  HENT:  Head: Normocephalic and atraumatic.  Right Ear: Tympanic membrane normal.  Left Ear: There is swelling.  Tympanic membrane is not erythematous and not retracted.  Nose: Rhinorrhea present. No mucosal edema.  Mouth/Throat: Uvula is midline and oropharynx is clear and moist.  Eyes: Conjunctivae are normal.  Neck: Neck supple.  Cardiovascular: Normal rate, regular rhythm, normal heart sounds and intact distal pulses.  Abdominal: Soft. Bowel sounds are normal.  Neurological: He is alert and oriented to person, place, and time.  Skin: Skin is warm and dry.     Psychiatric: Affect normal.  Vitals reviewed.    Assessment/Plan: 1. Viral URI with cough Supportive measures and OTC medications reviewed with patient. Tessalon per orders. - benzonatate (TESSALON) 100 MG capsule; Take 1 capsule (100 mg total) by mouth 2 (two) times daily as needed for cough.  Dispense: 20 capsule; Refill: 0  2. Other infective acute otitis externa of left ear Start Vosol-HC. Follow-up with ENT or PCP if not resolving. - acetic acid-hydrocortisone (VOSOL-HC) OTIC solution; Apply three drops into affected ear twice daily  Dispense: 10 mL; Refill: 0  3. Tinea corporis Continue Lotrimin. Supportive measures reviewed.   Leeanne Rio, PA-C

## 2017-04-16 NOTE — Telephone Encounter (Signed)
Please advise of medication discussed and will send to the pharmacy.

## 2017-10-19 ENCOUNTER — Ambulatory Visit (INDEPENDENT_AMBULATORY_CARE_PROVIDER_SITE_OTHER): Payer: PRIVATE HEALTH INSURANCE | Admitting: Internal Medicine

## 2017-10-19 ENCOUNTER — Other Ambulatory Visit (INDEPENDENT_AMBULATORY_CARE_PROVIDER_SITE_OTHER): Payer: PRIVATE HEALTH INSURANCE

## 2017-10-19 ENCOUNTER — Encounter: Payer: Self-pay | Admitting: Internal Medicine

## 2017-10-19 DIAGNOSIS — Z0000 Encounter for general adult medical examination without abnormal findings: Secondary | ICD-10-CM | POA: Diagnosis not present

## 2017-10-19 LAB — HEPATIC FUNCTION PANEL
ALBUMIN: 4.7 g/dL (ref 3.5–5.2)
ALT: 40 U/L (ref 0–53)
AST: 27 U/L (ref 0–37)
Alkaline Phosphatase: 50 U/L (ref 39–117)
Bilirubin, Direct: 0.2 mg/dL (ref 0.0–0.3)
TOTAL PROTEIN: 8 g/dL (ref 6.0–8.3)
Total Bilirubin: 0.8 mg/dL (ref 0.2–1.2)

## 2017-10-19 LAB — CBC WITH DIFFERENTIAL/PLATELET
BASOS PCT: 1.1 % (ref 0.0–3.0)
Basophils Absolute: 0.1 10*3/uL (ref 0.0–0.1)
EOS PCT: 2.8 % (ref 0.0–5.0)
Eosinophils Absolute: 0.1 10*3/uL (ref 0.0–0.7)
HCT: 46.9 % (ref 39.0–52.0)
Hemoglobin: 16.4 g/dL (ref 13.0–17.0)
LYMPHS ABS: 1.5 10*3/uL (ref 0.7–4.0)
Lymphocytes Relative: 27.4 % (ref 12.0–46.0)
MCHC: 34.9 g/dL (ref 30.0–36.0)
MCV: 87.3 fl (ref 78.0–100.0)
MONOS PCT: 7.4 % (ref 3.0–12.0)
Monocytes Absolute: 0.4 10*3/uL (ref 0.1–1.0)
NEUTROS ABS: 3.3 10*3/uL (ref 1.4–7.7)
Neutrophils Relative %: 61.3 % (ref 43.0–77.0)
Platelets: 163 10*3/uL (ref 150.0–400.0)
RBC: 5.37 Mil/uL (ref 4.22–5.81)
RDW: 12.9 % (ref 11.5–15.5)
WBC: 5.4 10*3/uL (ref 4.0–10.5)

## 2017-10-19 LAB — LIPID PANEL
CHOLESTEROL: 254 mg/dL — AB (ref 0–200)
HDL: 46.1 mg/dL (ref >39.00)
LDL Cholesterol: 173 mg/dL — ABNORMAL HIGH (ref 0–99)
NONHDL: 208.1
TRIGLYCERIDES: 178 mg/dL — AB (ref 0.0–149.0)
Total CHOL/HDL Ratio: 6
VLDL: 35.6 mg/dL (ref 0.0–40.0)

## 2017-10-19 LAB — URINALYSIS
BILIRUBIN URINE: NEGATIVE
Hgb urine dipstick: NEGATIVE
Ketones, ur: NEGATIVE
Leukocytes, UA: NEGATIVE
NITRITE: NEGATIVE
PH: 6.5 (ref 5.0–8.0)
Specific Gravity, Urine: 1.01 (ref 1.000–1.030)
TOTAL PROTEIN, URINE-UPE24: NEGATIVE
URINE GLUCOSE: NEGATIVE
Urobilinogen, UA: 0.2 (ref 0.0–1.0)

## 2017-10-19 LAB — BASIC METABOLIC PANEL
BUN: 16 mg/dL (ref 6–23)
CO2: 28 mEq/L (ref 19–32)
Calcium: 9.7 mg/dL (ref 8.4–10.5)
Chloride: 102 mEq/L (ref 96–112)
Creatinine, Ser: 0.87 mg/dL (ref 0.40–1.50)
GFR: 107.03 mL/min (ref >60.00)
Glucose, Bld: 104 mg/dL — ABNORMAL HIGH (ref 70–99)
POTASSIUM: 4.1 meq/L (ref 3.5–5.1)
SODIUM: 139 meq/L (ref 135–145)

## 2017-10-19 LAB — TSH: TSH: 1.23 u[IU]/mL (ref 0.35–4.50)

## 2017-10-19 MED ORDER — VITAMIN D3 50 MCG (2000 UT) PO CAPS: 2000.0000 [IU] | ORAL_CAPSULE | Freq: Every day | ORAL | 3 refills | Status: DC

## 2017-10-19 NOTE — Assessment & Plan Note (Signed)
We discussed age appropriate health related issues, including available/recomended screening tests and vaccinations. We discussed a need for adhering to healthy diet and exercise. Labs were ordered to be later reviewed . All questions were answered.   

## 2017-10-19 NOTE — Progress Notes (Signed)
Subjective:  Patient ID: Ralph Webster, male    DOB: 1986-04-14  Age: 32 y.o. MRN: [id number]  CC: No chief complaint on file.   HPI Ralph Webster presents for a well exam. Working night shifts  Outpatient Medications Prior to Visit  Medication Sig Dispense Refill  . acetic acid-hydrocortisone (VOSOL-HC) OTIC solution Apply three drops into affected ear twice daily 10 mL 0  . benzonatate (TESSALON) 100 MG capsule Take 1 capsule (100 mg total) by mouth 2 (two) times daily as needed for cough. 20 capsule 0  . naftifine (NAFTIN) 1 % cream Apply topically daily. 30 g 0   No facility-administered medications prior to visit.     ROS: Review of Systems  Constitutional: Negative for appetite change, fatigue and unexpected weight change.  HENT: Negative for congestion, nosebleeds, sneezing, sore throat and trouble swallowing.   Eyes: Negative for itching and visual disturbance.  Respiratory: Negative for cough.   Cardiovascular: Negative for chest pain, palpitations and leg swelling.  Gastrointestinal: Negative for abdominal distention, blood in stool, diarrhea and nausea.  Genitourinary: Negative for frequency and hematuria.  Musculoskeletal: Negative for back pain, gait problem, joint swelling and neck pain.  Skin: Negative for rash.  Neurological: Negative for dizziness, tremors, speech difficulty and weakness.  Psychiatric/Behavioral: Negative for agitation, dysphoric mood, sleep disturbance and suicidal ideas. The patient is not nervous/anxious.     Objective:  BP 116/82 (BP Location: Left Arm, Patient Position: Sitting, Cuff Size: Large)   Pulse 67   Temp 98.7 F (37.1 C) (Oral)   Ht 6\' 4"  (1.93 m)   Wt 253 lb (114.8 kg)   SpO2 98%   BMI 30.80 kg/m   BP Readings from Last 3 Encounters:  10/19/17 116/82  04/16/17 118/84  01/25/17 124/84    Wt Readings from Last 3 Encounters:  10/19/17 253 lb (114.8 kg)  04/16/17 255 lb (115.7 kg)  01/25/17 255 lb (115.7 kg)     Physical Exam  Constitutional: He is oriented to person, place, and time. He appears well-developed. No distress.  NAD  HENT:  Mouth/Throat: Oropharynx is clear and moist.  Eyes: Pupils are equal, round, and reactive to light. Conjunctivae are normal.  Neck: Normal range of motion. No JVD present. No thyromegaly present.  Cardiovascular: Normal rate, regular rhythm, normal heart sounds and intact distal pulses. Exam reveals no gallop and no friction rub.  No murmur heard. Pulmonary/Chest: Effort normal and breath sounds normal. No respiratory distress. He has no wheezes. He has no rales. He exhibits no tenderness.  Abdominal: Soft. Bowel sounds are normal. He exhibits no distension and no mass. There is no tenderness. There is no rebound and no guarding.  Musculoskeletal: Normal range of motion. He exhibits no edema or tenderness.  Lymphadenopathy:    He has no cervical adenopathy.  Neurological: He is alert and oriented to person, place, and time. He has normal reflexes. No cranial nerve deficit. He exhibits normal muscle tone. He displays a negative Romberg sign. Coordination and gait normal.  Skin: Skin is warm and dry. No rash noted.  Psychiatric: He has a normal mood and affect. His behavior is normal. Judgment and thought content normal.    Lab Results  Component Value Date   WBC 5.4 10/29/2015   HGB 15.9 10/29/2015   HCT 46.1 10/29/2015   PLT 169.0 10/29/2015   GLUCOSE 92 10/29/2015   CHOL 179 08/08/2008   TRIG 115.0 08/08/2008   HDL 43.10 08/08/2008   [place]  113 (H) 08/08/2008   ALT 12 08/14/2014   AST 13 08/14/2014   NA 139 10/29/2015   K 4.3 10/29/2015   CL 102 10/29/2015   CREATININE 0.84 10/29/2015   BUN 18 10/29/2015   CO2 31 10/29/2015   TSH 2.39 08/14/2014    Dg Chest 2 View  Result Date: 05/19/2012 *RADIOLOGY REPORT* Clinical Data: Fever, cough. CHEST - 2 VIEW Comparison: 06/20/2009 Findings: No confluent airspace opacity.  Heart is upper limits  normal in size.  Mild peribronchial thickening.  No effusions or acute bony abnormality. IMPRESSION: Mild bronchitic changes. Original Report Authenticated By: Rolm Baptise, M.D.    Assessment & Plan:   There are no diagnoses linked to this encounter.   No orders of the defined types were placed in this encounter.    Follow-up: No follow-ups on file.  Walker Kehr, MD

## 2017-10-19 NOTE — Patient Instructions (Signed)
Melatonin Valerian root

## 2018-10-31 ENCOUNTER — Ambulatory Visit: Payer: PRIVATE HEALTH INSURANCE | Admitting: Internal Medicine

## 2018-11-08 ENCOUNTER — Encounter: Payer: PRIVATE HEALTH INSURANCE | Admitting: Internal Medicine

## 2019-02-08 ENCOUNTER — Encounter: Payer: PRIVATE HEALTH INSURANCE | Admitting: Internal Medicine

## 2019-03-01 ENCOUNTER — Other Ambulatory Visit: Payer: Self-pay

## 2019-03-01 ENCOUNTER — Encounter: Payer: Self-pay | Admitting: Internal Medicine

## 2019-03-01 ENCOUNTER — Ambulatory Visit (INDEPENDENT_AMBULATORY_CARE_PROVIDER_SITE_OTHER): Payer: PRIVATE HEALTH INSURANCE | Admitting: Internal Medicine

## 2019-03-01 ENCOUNTER — Other Ambulatory Visit (INDEPENDENT_AMBULATORY_CARE_PROVIDER_SITE_OTHER): Payer: PRIVATE HEALTH INSURANCE

## 2019-03-01 VITALS — BP 126/84 | HR 75 | Temp 98.5°F | Ht 76.0 in | Wt 244.0 lb

## 2019-03-01 DIAGNOSIS — Z0000 Encounter for general adult medical examination without abnormal findings: Secondary | ICD-10-CM

## 2019-03-01 DIAGNOSIS — Z8616 Personal history of COVID-19: Secondary | ICD-10-CM | POA: Insufficient documentation

## 2019-03-01 DIAGNOSIS — G933 Postviral fatigue syndrome: Secondary | ICD-10-CM

## 2019-03-01 DIAGNOSIS — Z8619 Personal history of other infectious and parasitic diseases: Secondary | ICD-10-CM | POA: Diagnosis not present

## 2019-03-01 DIAGNOSIS — G9331 Postviral fatigue syndrome: Secondary | ICD-10-CM

## 2019-03-01 LAB — URINALYSIS
Bilirubin Urine: NEGATIVE
Ketones, ur: NEGATIVE
Leukocytes,Ua: NEGATIVE
Nitrite: NEGATIVE
Specific Gravity, Urine: 1.025 (ref 1.000–1.030)
Total Protein, Urine: NEGATIVE
Urine Glucose: NEGATIVE
Urobilinogen, UA: 0.2 (ref 0.0–1.0)
pH: 6 (ref 5.0–8.0)

## 2019-03-01 LAB — HEPATIC FUNCTION PANEL
ALT: 17 U/L (ref 0–53)
AST: 14 U/L (ref 0–37)
Albumin: 4.8 g/dL (ref 3.5–5.2)
Alkaline Phosphatase: 42 U/L (ref 39–117)
Bilirubin, Direct: 0.1 mg/dL (ref 0.0–0.3)
Total Bilirubin: 0.8 mg/dL (ref 0.2–1.2)
Total Protein: 7.8 g/dL (ref 6.0–8.3)

## 2019-03-01 LAB — CBC WITH DIFFERENTIAL/PLATELET
Basophils Absolute: 0.1 10*3/uL (ref 0.0–0.1)
Basophils Relative: 1.5 % (ref 0.0–3.0)
Eosinophils Absolute: 0.1 10*3/uL (ref 0.0–0.7)
Eosinophils Relative: 2.5 % (ref 0.0–5.0)
HCT: 47.8 % (ref 39.0–52.0)
Hemoglobin: 16.4 g/dL (ref 13.0–17.0)
Lymphocytes Relative: 37.3 % (ref 12.0–46.0)
Lymphs Abs: 2.1 10*3/uL (ref 0.7–4.0)
MCHC: 34.4 g/dL (ref 30.0–36.0)
MCV: 90.1 fl (ref 78.0–100.0)
Monocytes Absolute: 0.4 10*3/uL (ref 0.1–1.0)
Monocytes Relative: 6.9 % (ref 3.0–12.0)
Neutro Abs: 2.9 10*3/uL (ref 1.4–7.7)
Neutrophils Relative %: 51.8 % (ref 43.0–77.0)
Platelets: 162 10*3/uL (ref 150.0–400.0)
RBC: 5.3 Mil/uL (ref 4.22–5.81)
RDW: 12.9 % (ref 11.5–15.5)
WBC: 5.6 10*3/uL (ref 4.0–10.5)

## 2019-03-01 LAB — BASIC METABOLIC PANEL
BUN: 13 mg/dL (ref 6–23)
CO2: 28 mEq/L (ref 19–32)
Calcium: 9.5 mg/dL (ref 8.4–10.5)
Chloride: 104 mEq/L (ref 96–112)
Creatinine, Ser: 0.99 mg/dL (ref 0.40–1.50)
GFR: 86.05 mL/min (ref >60.00)
Glucose, Bld: 101 mg/dL — ABNORMAL HIGH (ref 70–99)
Potassium: 3.7 mEq/L (ref 3.5–5.1)
Sodium: 141 mEq/L (ref 135–145)

## 2019-03-01 LAB — LIPID PANEL
Cholesterol: 240 mg/dL — ABNORMAL HIGH (ref 0–200)
HDL: 47.6 mg/dL (ref >39.00)
LDL Cholesterol: 161 mg/dL — ABNORMAL HIGH (ref 0–99)
NonHDL: 192.82
Total CHOL/HDL Ratio: 5
Triglycerides: 161 mg/dL — ABNORMAL HIGH (ref 0.0–149.0)
VLDL: 32.2 mg/dL (ref 0.0–40.0)

## 2019-03-01 NOTE — Patient Instructions (Signed)

## 2019-03-01 NOTE — Assessment & Plan Note (Addendum)
post-COVID - better Labs

## 2019-03-01 NOTE — Progress Notes (Addendum)
Subjective:  Patient ID: Ralph Webster, male    DOB: 1986-03-24  Age: 33 y.o. MRN: [id number]  CC: No chief complaint on file.   HPI Ralph Webster presents for a well exam Had COVID in July - fatigue, lost wt Pt wants to loose more wt  Outpatient Medications Prior to Visit  Medication Sig Dispense Refill  . Cholecalciferol (VITAMIN D3) 2000 units capsule Take 1 capsule (2,000 Units total) by mouth daily. 100 capsule 3   No facility-administered medications prior to visit.     ROS: Review of Systems  Constitutional: Positive for unexpected weight change. Negative for appetite change and fatigue.  HENT: Negative for congestion, nosebleeds, sneezing, sore throat and trouble swallowing.   Eyes: Negative for itching and visual disturbance.  Respiratory: Negative for cough.   Cardiovascular: Negative for chest pain, palpitations and leg swelling.  Gastrointestinal: Negative for abdominal distention, blood in stool, diarrhea and nausea.  Genitourinary: Negative for frequency and hematuria.  Musculoskeletal: Negative for back pain, gait problem, joint swelling and neck pain.  Skin: Negative for rash.  Neurological: Negative for dizziness, tremors, speech difficulty and weakness.  Psychiatric/Behavioral: Negative for agitation, dysphoric mood and sleep disturbance. The patient is not nervous/anxious.     Objective:  BP 126/84 (BP Location: Left Arm, Patient Position: Sitting, Cuff Size: Large)   Pulse 75   Temp 98.5 F (36.9 C) (Oral)   Ht 6\' 4"  (1.93 m)   Wt 244 lb (110.7 kg)   SpO2 98%   BMI 29.70 kg/m   BP Readings from Last 3 Encounters:  03/01/19 126/84  10/19/17 116/82  04/16/17 118/84    Wt Readings from Last 3 Encounters:  03/01/19 244 lb (110.7 kg)  10/19/17 253 lb (114.8 kg)  04/16/17 255 lb (115.7 kg)    Physical Exam Constitutional:      General: He is not in acute distress.    Appearance: He is well-developed.     Comments: NAD  Eyes:   Conjunctiva/sclera: Conjunctivae normal.     Pupils: Pupils are equal, round, and reactive to light.  Neck:     Musculoskeletal: Normal range of motion.     Thyroid: No thyromegaly.     Vascular: No JVD.  Cardiovascular:     Rate and Rhythm: Normal rate and regular rhythm.     Heart sounds: Normal heart sounds. No murmur. No friction rub. No gallop.   Pulmonary:     Effort: Pulmonary effort is normal. No respiratory distress.     Breath sounds: Normal breath sounds. No wheezing or rales.  Chest:     Chest wall: No tenderness.  Abdominal:     General: Bowel sounds are normal. There is no distension.     Palpations: Abdomen is soft. There is no mass.     Tenderness: There is no abdominal tenderness. There is no guarding or rebound.  Musculoskeletal: Normal range of motion.        General: No tenderness.  Lymphadenopathy:     Cervical: No cervical adenopathy.  Skin:    General: Skin is warm and dry.     Findings: No rash.  Neurological:     Mental Status: He is alert and oriented to person, place, and time.     Cranial Nerves: No cranial nerve deficit.     Motor: No abnormal muscle tone.     Coordination: Coordination normal.     Gait: Gait normal.     Deep Tendon Reflexes: Reflexes are normal  and symmetric.  Psychiatric:        Behavior: Behavior normal.        Thought Content: Thought content normal.        Judgment: Judgment normal.     Lab Results  Component Value Date   WBC 5.4 10/19/2017   HGB 16.4 10/19/2017   HCT 46.9 10/19/2017   PLT 163.0 10/19/2017   GLUCOSE 104 (H) 10/19/2017   CHOL 254 (H) 10/19/2017   TRIG 178.0 (H) 10/19/2017   HDL 46.10 10/19/2017   LDLCALC 173 (H) 10/19/2017   ALT 40 10/19/2017   AST 27 10/19/2017   NA 139 10/19/2017   K 4.1 10/19/2017   CL 102 10/19/2017   CREATININE 0.87 10/19/2017   BUN 16 10/19/2017   CO2 28 10/19/2017   TSH 1.23 10/19/2017    Dg Chest 2 View  Result Date: 05/19/2012 *RADIOLOGY REPORT* Clinical Data:  Fever, cough. CHEST - 2 VIEW Comparison: 06/20/2009 Findings: No confluent airspace opacity.  Heart is upper limits normal in size.  Mild peribronchial thickening.  No effusions or acute bony abnormality. IMPRESSION: Mild bronchitic changes. Original Report Authenticated By: Rolm Baptise, M.D.    Assessment & Plan:   There are no diagnoses linked to this encounter.   No orders of the defined types were placed in this encounter.    Follow-up: No follow-ups on file.  Walker Kehr, MD

## 2019-03-01 NOTE — Assessment & Plan Note (Signed)
Check AB

## 2019-03-01 NOTE — Assessment & Plan Note (Signed)
We discussed age appropriate health related issues, including available/recomended screening tests and vaccinations. We discussed a need for adhering to healthy diet and exercise. Labs were ordered to be later reviewed . All questions were answered.   

## 2019-03-02 LAB — VITAMIN D 25 HYDROXY (VIT D DEFICIENCY, FRACTURES): VITD: 33.06 ng/mL (ref 30.00–100.00)

## 2019-03-02 LAB — SAR COV2 SEROLOGY (COVID19)AB(IGG),IA: SARS CoV2 AB IGG: POSITIVE — AB

## 2019-03-02 LAB — TSH: TSH: 2.33 u[IU]/mL (ref 0.35–4.50)

## 2019-03-02 LAB — TESTOSTERONE: Testosterone: 247.67 ng/dL — ABNORMAL LOW (ref 300.00–890.00)

## 2019-03-02 LAB — VITAMIN B12: Vitamin B-12: 310 pg/mL (ref 211–911)

## 2019-11-21 ENCOUNTER — Encounter: Payer: Self-pay | Admitting: Internal Medicine

## 2019-11-21 ENCOUNTER — Other Ambulatory Visit: Payer: Self-pay

## 2019-11-21 ENCOUNTER — Ambulatory Visit (INDEPENDENT_AMBULATORY_CARE_PROVIDER_SITE_OTHER): Payer: PRIVATE HEALTH INSURANCE | Admitting: Internal Medicine

## 2019-11-21 VITALS — BP 122/84 | HR 67 | Temp 98.5°F | Ht 76.0 in | Wt 241.0 lb

## 2019-11-21 DIAGNOSIS — R41840 Attention and concentration deficit: Secondary | ICD-10-CM

## 2019-11-21 DIAGNOSIS — E559 Vitamin D deficiency, unspecified: Secondary | ICD-10-CM | POA: Diagnosis not present

## 2019-11-21 DIAGNOSIS — E785 Hyperlipidemia, unspecified: Secondary | ICD-10-CM

## 2019-11-21 DIAGNOSIS — Z0000 Encounter for general adult medical examination without abnormal findings: Secondary | ICD-10-CM | POA: Diagnosis not present

## 2019-11-21 LAB — COMPLETE METABOLIC PANEL WITH GFR
AG Ratio: 1.8 (calc) (ref 1.0–2.5)
ALT: 13 U/L (ref 9–46)
AST: 13 U/L (ref 10–40)
Albumin: 4.9 g/dL (ref 3.6–5.1)
Alkaline phosphatase (APISO): 41 U/L (ref 36–130)
BUN: 13 mg/dL (ref 7–25)
CO2: 29 mmol/L (ref 20–32)
Calcium: 9.6 mg/dL (ref 8.6–10.3)
Chloride: 105 mmol/L (ref 98–110)
Creat: 0.96 mg/dL (ref 0.60–1.35)
GFR, Est African American: 118 mL/min/{1.73_m2} (ref >=60)
GFR, Est Non African American: 102 mL/min/{1.73_m2} (ref >=60)
Globulin: 2.8 g/dL (calc) (ref 1.9–3.7)
Glucose, Bld: 104 mg/dL — ABNORMAL HIGH (ref 65–99)
Potassium: 4.5 mmol/L (ref 3.5–5.3)
Sodium: 141 mmol/L (ref 135–146)
Total Bilirubin: 0.7 mg/dL (ref 0.2–1.2)
Total Protein: 7.7 g/dL (ref 6.1–8.1)

## 2019-11-21 NOTE — Assessment & Plan Note (Addendum)
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

## 2019-11-21 NOTE — Progress Notes (Signed)
   Subjective:  Patient ID: Ralph Webster, male    DOB: 1986-04-07  Age: 34 y.o. MRN: [id number]  CC: No chief complaint on file.   HPI Ralph Webster presents for a well exam C/o decreased focus x months. Issues w focus since high school  F/u low Vit D C/o family h/o CAD, heart disease   Outpatient Medications Prior to Visit  Medication Sig Dispense Refill  . Cholecalciferol (VITAMIN D3) 2000 units capsule Take 1 capsule (2,000 Units total) by mouth daily. 100 capsule 3   No facility-administered medications prior to visit.    ROS: Review of Systems  Constitutional: Negative for appetite change, fatigue and unexpected weight change.  HENT: Negative for congestion, nosebleeds, sneezing, sore throat and trouble swallowing.   Eyes: Negative for itching and visual disturbance.  Respiratory: Negative for cough.   Cardiovascular: Negative for chest pain, palpitations and leg swelling.  Gastrointestinal: Negative for abdominal distention, blood in stool, diarrhea and nausea.  Genitourinary: Negative for frequency and hematuria.  Musculoskeletal: Negative for back pain, gait problem, joint swelling and neck pain.  Skin: Negative for rash.  Neurological: Negative for dizziness, tremors, speech difficulty and weakness.  Psychiatric/Behavioral: Positive for decreased concentration. Negative for agitation, dysphoric mood and sleep disturbance. The patient is not nervous/anxious.     Objective:  BP 122/84 (BP Location: Right Arm, Patient Position: Sitting, Cuff Size: Large)   Pulse 67   Temp 98.5 F (36.9 C) (Oral)   Ht 6\' 4"  (1.93 m)   Wt 241 lb (109.3 kg)   SpO2 97%   BMI 29.34 kg/m   BP Readings from Last 3 Encounters:  11/21/19 122/84  03/01/19 126/84  10/19/17 116/82    Wt Readings from Last 3 Encounters:  11/21/19 241 lb (109.3 kg)  03/01/19 244 lb (110.7 kg)  10/19/17 253 lb (114.8 kg)    Physical Exam  Lab Results  Component Value Date   WBC 5.6 03/01/2019    HGB 16.4 03/01/2019   HCT 47.8 03/01/2019   PLT 162.0 03/01/2019   GLUCOSE 101 (H) 03/01/2019   CHOL 240 (H) 03/01/2019   TRIG 161.0 (H) 03/01/2019   HDL 47.60 03/01/2019   LDLCALC 161 (H) 03/01/2019   ALT 17 03/01/2019   AST 14 03/01/2019   NA 141 03/01/2019   K 3.7 03/01/2019   CL 104 03/01/2019   CREATININE 0.99 03/01/2019   BUN 13 03/01/2019   CO2 28 03/01/2019   TSH 2.33 03/01/2019    DG Chest 2 View  Result Date: 05/19/2012 *RADIOLOGY REPORT* Clinical Data: Fever, cough. CHEST - 2 VIEW Comparison: 06/20/2009 Findings: No confluent airspace opacity.  Heart is upper limits normal in size.  Mild peribronchial thickening.  No effusions or acute bony abnormality. IMPRESSION: Mild bronchitic changes. Original Report Authenticated By: Rolm Baptise, M.D.    Assessment & Plan:   There are no diagnoses linked to this encounter.   No orders of the defined types were placed in this encounter.    Follow-up: No follow-ups on file.  Walker Kehr, MD

## 2019-11-21 NOTE — Assessment & Plan Note (Signed)

## 2019-11-21 NOTE — Assessment & Plan Note (Signed)
On Vit D 

## 2019-11-21 NOTE — Patient Instructions (Addendum)
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 Like traditional x-rays, it produces multiple images or pictures of the inside of the body. The cross-sectional images generated during a CT scan can be reformatted in multiple planes. They can even generate three-dimensional images. These images can be viewed on a computer monitor, printed on film or by a 3D printer, or transferred to a CD or DVD. CT images of internal organs, bones, soft tissue and blood vessels provide greater detail than traditional x-rays, particularly of soft tissues and blood vessels. A cardiac CT scan for coronary calcium is a non-invasive way of obtaining information about the presence, location and extent of calcified plaque in the coronary arteries--the vessels that supply oxygen-containing blood to the heart muscle. Calcified plaque results when there is a build-up of fat and other substances under the inner layer of the artery. This material can calcify which signals the presence of atherosclerosis, a disease of the vessel wall, also called coronary artery disease (CAD). People with this disease have an increased risk for heart attacks. In addition, over time, progression of plaque build up (CAD) can narrow the arteries or even close off blood flow to the heart. The result may be chest pain, sometimes called "angina," or a heart attack. Because calcium is a marker of CAD, the amount of calcium detected on a cardiac CT scan is a helpful prognostic tool. The findings on cardiac CT are expressed as a calcium score. Another name for this test is coronary artery calcium scoring.  What are some common uses of the procedure? The goal of cardiac CT scan for calcium scoring is to determine if CAD is present and to what extent, even if there are no symptoms. It is a screening  study that may be recommended by a physician for patients with risk factors for CAD but no clinical symptoms. The major risk factors for CAD are:  high blood cholesterol levels   family history of heart attacks   diabetes   high blood pressure   cigarette smoking   overweight or obese   physical inactivity   A negative cardiac CT scan for calcium scoring shows no calcification within the coronary arteries. This suggests that CAD is absent or so minimal it cannot be seen by this technique. The chance of having a heart attack over the next two to five years is very low under these circumstances. A positive test means that CAD is present, regardless of whether or not the patient is experiencing any symptoms. The amount of calcification--expressed as the calcium score--may help to predict the likelihood of a myocardial infarction (heart attack) in the coming years and helps your medical doctor or cardiologist decide whether the patient may need to take preventive medicine or undertake other measures such as diet and exercise to lower the risk for heart attack. The extent of CAD is graded according to your calcium score:  Calcium Score  Presence of CAD (coronary artery disease)  0 No evidence of CAD   1-10 Minimal evidence of CAD  11-100 Mild evidence of CAD  101-400 Moderate evidence of CAD  Over 400 Extensive evidence of CAD     There are natural ways to boost your testosterone:  1. Lose Weight If you're overweight, shedding the excess pounds may increase your testosterone levels, according  to multiple research. Overweight men are more likely to have low testosterone levels to begin with, so this is an important trick to increase your body's testosterone production when you need it most.   2. Strength Training    Strength training is also known to boost testosterone levels, provided you are doing so intensely enough. When strength training to boost testosterone, you'll want to  increase the weight and lower your number of reps, and then focus on exercises that work a large number of muscles.  3. Optimize Your Vitamin D Levels Vitamin D, a steroid hormone, is essential for the healthy development of the nucleus of the sperm cell, and helps maintain semen quality and sperm count. Vitamin D also increases levels of testosterone, which may boost libido. In one study, overweight men who were given vitamin D supplements had a significant increase in testosterone levels after one year.  4. Reduce Stress When you're under a lot of stress, your body releases high levels of the stress hormone cortisol. This hormone actually blocks the effects of testosterone, presumably because, from a biological standpoint, testosterone-associated behaviors (mating, competing, aggression) may have lowered your chances of survival in an emergency (hence, the "fight or flight" response is dominant, courtesy of cortisol).  5. Limit or Eliminate Sugar from Your Diet Testosterone levels decrease after you eat sugar, which is likely because the sugar leads to a high insulin level, another factor leading to low testosterone.  6. Eat Healthy Fats By healthy, this means not only mon- and polyunsaturated fats, like that found in avocadoes and nuts, but also saturated, as these are essential for building testosterone. Research shows that a diet with less than 40 percent of energy as fat (and that mainly from animal sources, i.e. saturated) lead to a decrease in testosterone levels.  It's important to understand that your body requires saturated fats from animal and vegetable sources (such as meat, dairy, certain oils, and tropical plants like coconut) for optimal functioning, and if you neglect this important food group in favor of sugar, grains and other starchy carbs, your health and weight are almost guaranteed to suffer. Examples of healthy fats you can eat more of to give your testosterone levels a boost  include:  Olives and Olive oil  Coconuts and coconut oil Butter made from organic milk  Raw nuts, such as, almonds or pecans Eggs Avocados   Meats Palm oil Unheated organic nut oils   7. "Testosterone boosters" containing Vitamin D-3, Niacin, Vitamin B-6, Vitamin B-12, Magnesium, Zinc, Selenium, D-Aspartic Acid, Fenugreed Seed Extract, Oystershell, Suma Extract, [place] Ginseng may be helpful as well.

## 2019-11-21 NOTE — Assessment & Plan Note (Signed)
   Cardiac CT calcium scoring test offered 7/21

## 2019-11-22 LAB — URINALYSIS
Bilirubin Urine: NEGATIVE
Glucose, UA: NEGATIVE
Hgb urine dipstick: NEGATIVE
Ketones, ur: NEGATIVE
Leukocytes,Ua: NEGATIVE
Nitrite: NEGATIVE
Protein, ur: NEGATIVE
Specific Gravity, Urine: 1.02 (ref 1.001–1.03)
pH: 6 (ref 5.0–8.0)

## 2019-11-22 LAB — CBC WITH DIFFERENTIAL/PLATELET
Absolute Monocytes: 322 cells/uL (ref 200–950)
Basophils Absolute: 41 cells/uL (ref 0–200)
Basophils Relative: 0.9 %
Eosinophils Absolute: 110 cells/uL (ref 15–500)
Eosinophils Relative: 2.4 %
HCT: 47.5 % (ref 38.5–50.0)
Hemoglobin: 15.9 g/dL (ref 13.2–17.1)
Lymphs Abs: 1467 cells/uL (ref 850–3900)
MCH: 30.3 pg (ref 27.0–33.0)
MCHC: 33.5 g/dL (ref 32.0–36.0)
MCV: 90.6 fL (ref 80.0–100.0)
MPV: 12.1 fL (ref 7.5–12.5)
Monocytes Relative: 7 %
Neutro Abs: 2659 cells/uL (ref 1500–7800)
Neutrophils Relative %: 57.8 %
Platelets: 164 10*3/uL (ref 140–400)
RBC: 5.24 10*6/uL (ref 4.20–5.80)
RDW: 12.7 % (ref 11.0–15.0)
Total Lymphocyte: 31.9 %
WBC: 4.6 10*3/uL (ref 3.8–10.8)

## 2019-11-22 LAB — LIPID PANEL
Cholesterol: 237 mg/dL — ABNORMAL HIGH (ref <200)
HDL: 48 mg/dL (ref >=40)
LDL Cholesterol (Calc): 162 mg/dL (calc) — ABNORMAL HIGH
Non-HDL Cholesterol (Calc): 189 mg/dL (calc) — ABNORMAL HIGH (ref <130)
Total CHOL/HDL Ratio: 4.9 (calc) (ref <5.0)
Triglycerides: 138 mg/dL (ref <150)

## 2019-11-22 LAB — VITAMIN B12: Vitamin B-12: 529 pg/mL (ref 200–1100)

## 2019-11-22 LAB — TSH: TSH: 6.66 mIU/L — ABNORMAL HIGH (ref 0.40–4.50)

## 2019-11-22 LAB — TESTOSTERONE: Testosterone: 237 ng/dL — ABNORMAL LOW (ref 250–827)

## 2019-11-22 LAB — VITAMIN D 25 HYDROXY (VIT D DEFICIENCY, FRACTURES): Vit D, 25-Hydroxy: 38 ng/mL (ref 30–100)

## 2020-03-06 ENCOUNTER — Encounter: Payer: PRIVATE HEALTH INSURANCE | Admitting: Internal Medicine

## 2020-06-10 ENCOUNTER — Ambulatory Visit (INDEPENDENT_AMBULATORY_CARE_PROVIDER_SITE_OTHER): Payer: No Typology Code available for payment source | Admitting: Psychology

## 2020-06-10 DIAGNOSIS — F4322 Adjustment disorder with anxiety: Secondary | ICD-10-CM

## 2020-06-24 ENCOUNTER — Ambulatory Visit: Payer: No Typology Code available for payment source | Admitting: Psychology

## 2020-06-27 ENCOUNTER — Ambulatory Visit (INDEPENDENT_AMBULATORY_CARE_PROVIDER_SITE_OTHER): Payer: No Typology Code available for payment source | Admitting: Psychology

## 2020-06-27 ENCOUNTER — Ambulatory Visit: Payer: PRIVATE HEALTH INSURANCE | Admitting: Psychology

## 2020-06-27 DIAGNOSIS — F4329 Adjustment disorder with other symptoms: Secondary | ICD-10-CM

## 2020-06-27 DIAGNOSIS — G3184 Mild cognitive impairment, so stated: Secondary | ICD-10-CM | POA: Diagnosis not present

## 2020-06-27 DIAGNOSIS — F4322 Adjustment disorder with anxiety: Secondary | ICD-10-CM | POA: Diagnosis not present

## 2020-11-26 ENCOUNTER — Ambulatory Visit (INDEPENDENT_AMBULATORY_CARE_PROVIDER_SITE_OTHER): Payer: No Typology Code available for payment source | Admitting: Internal Medicine

## 2020-11-26 ENCOUNTER — Encounter: Payer: Self-pay | Admitting: Internal Medicine

## 2020-11-26 ENCOUNTER — Other Ambulatory Visit: Payer: Self-pay

## 2020-11-26 VITALS — BP 118/80 | HR 87 | Temp 98.9°F | Ht 76.0 in | Wt 243.0 lb

## 2020-11-26 DIAGNOSIS — Z0000 Encounter for general adult medical examination without abnormal findings: Secondary | ICD-10-CM | POA: Diagnosis not present

## 2020-11-26 DIAGNOSIS — R41840 Attention and concentration deficit: Secondary | ICD-10-CM | POA: Diagnosis not present

## 2020-11-26 DIAGNOSIS — G933 Postviral fatigue syndrome: Secondary | ICD-10-CM | POA: Diagnosis not present

## 2020-11-26 DIAGNOSIS — G9331 Postviral fatigue syndrome: Secondary | ICD-10-CM

## 2020-11-26 DIAGNOSIS — E559 Vitamin D deficiency, unspecified: Secondary | ICD-10-CM

## 2020-11-26 DIAGNOSIS — R7989 Other specified abnormal findings of blood chemistry: Secondary | ICD-10-CM

## 2020-11-26 LAB — CBC WITH DIFFERENTIAL/PLATELET
Basophils Absolute: 0 10*3/uL (ref 0.0–0.1)
Basophils Relative: 1 % (ref 0.0–3.0)
Eosinophils Absolute: 0.1 10*3/uL (ref 0.0–0.7)
Eosinophils Relative: 1.8 % (ref 0.0–5.0)
HCT: 44.1 % (ref 39.0–52.0)
Hemoglobin: 15 g/dL (ref 13.0–17.0)
Lymphocytes Relative: 36.1 % (ref 12.0–46.0)
Lymphs Abs: 1.8 10*3/uL (ref 0.7–4.0)
MCHC: 34 g/dL (ref 30.0–36.0)
MCV: 88.2 fl (ref 78.0–100.0)
Monocytes Absolute: 0.3 10*3/uL (ref 0.1–1.0)
Monocytes Relative: 6.7 % (ref 3.0–12.0)
Neutro Abs: 2.7 10*3/uL (ref 1.4–7.7)
Neutrophils Relative %: 54.4 % (ref 43.0–77.0)
Platelets: 154 10*3/uL (ref 150.0–400.0)
RBC: 5.01 Mil/uL (ref 4.22–5.81)
RDW: 12.5 % (ref 11.5–15.5)
WBC: 4.9 10*3/uL (ref 4.0–10.5)

## 2020-11-26 LAB — COMPREHENSIVE METABOLIC PANEL
ALT: 11 U/L (ref 0–53)
AST: 14 U/L (ref 0–37)
Albumin: 4.7 g/dL (ref 3.5–5.2)
Alkaline Phosphatase: 37 U/L — ABNORMAL LOW (ref 39–117)
BUN: 16 mg/dL (ref 6–23)
CO2: 30 mEq/L (ref 19–32)
Calcium: 9.5 mg/dL (ref 8.4–10.5)
Chloride: 103 mEq/L (ref 96–112)
Creatinine, Ser: 0.85 mg/dL (ref 0.40–1.50)
GFR: 111.65 mL/min (ref >60.00)
Glucose, Bld: 92 mg/dL (ref 70–99)
Potassium: 3.7 mEq/L (ref 3.5–5.1)
Sodium: 139 mEq/L (ref 135–145)
Total Bilirubin: 0.7 mg/dL (ref 0.2–1.2)
Total Protein: 7.8 g/dL (ref 6.0–8.3)

## 2020-11-26 LAB — URINALYSIS
Bilirubin Urine: NEGATIVE
Hgb urine dipstick: NEGATIVE
Ketones, ur: NEGATIVE
Leukocytes,Ua: NEGATIVE
Nitrite: NEGATIVE
Specific Gravity, Urine: 1.015 (ref 1.000–1.030)
Total Protein, Urine: NEGATIVE
Urine Glucose: NEGATIVE
Urobilinogen, UA: 0.2 (ref 0.0–1.0)
pH: 7 (ref 5.0–8.0)

## 2020-11-26 LAB — HEPATIC FUNCTION PANEL
ALT: 11 U/L (ref 0–53)
AST: 14 U/L (ref 0–37)
Albumin: 4.7 g/dL (ref 3.5–5.2)
Alkaline Phosphatase: 37 U/L — ABNORMAL LOW (ref 39–117)
Bilirubin, Direct: 0.2 mg/dL (ref 0.0–0.3)
Total Bilirubin: 0.7 mg/dL (ref 0.2–1.2)
Total Protein: 7.8 g/dL (ref 6.0–8.3)

## 2020-11-26 LAB — LIPID PANEL
Cholesterol: 188 mg/dL (ref 0–200)
HDL: 45.6 mg/dL (ref >39.00)
LDL Cholesterol: 127 mg/dL — ABNORMAL HIGH (ref 0–99)
NonHDL: 142.77
Total CHOL/HDL Ratio: 4
Triglycerides: 78 mg/dL (ref 0.0–149.0)
VLDL: 15.6 mg/dL (ref 0.0–40.0)

## 2020-11-26 LAB — LUTEINIZING HORMONE: LH: 1.91 m[IU]/mL (ref 1.50–9.30)

## 2020-11-26 LAB — FOLLICLE STIMULATING HORMONE: FSH: 2.7 m[IU]/mL (ref 1.4–18.1)

## 2020-11-26 LAB — TSH: TSH: 3.85 u[IU]/mL (ref 0.35–5.50)

## 2020-11-26 LAB — T3, FREE: T3, Free: 3.8 pg/mL (ref 2.3–4.2)

## 2020-11-26 LAB — T4, FREE: Free T4: 0.95 ng/dL (ref 0.60–1.60)

## 2020-11-26 NOTE — Progress Notes (Signed)
Subjective:  Patient ID: Ralph Webster, male    DOB: February 24, 1986  Age: 35 y.o. MRN: [id number]  CC: Annual Exam   HPI Ralph Webster presents for a well exam C/o post COVID fatigue, brain fog   Outpatient Medications Prior to Visit  Medication Sig Dispense Refill   Cholecalciferol (VITAMIN D3) 2000 units capsule Take 1 capsule (2,000 Units total) by mouth daily. 100 capsule 3   No facility-administered medications prior to visit.    ROS: Review of Systems  Constitutional:  Positive for fatigue. Negative for appetite change and unexpected weight change.  HENT:  Negative for congestion, nosebleeds, sneezing, sore throat and trouble swallowing.   Eyes:  Negative for itching and visual disturbance.  Respiratory:  Negative for cough.   Cardiovascular:  Negative for chest pain, palpitations and leg swelling.  Gastrointestinal:  Negative for abdominal distention, blood in stool, diarrhea and nausea.  Genitourinary:  Negative for frequency and hematuria.  Musculoskeletal:  Negative for back pain, gait problem, joint swelling and neck pain.  Skin:  Negative for rash.  Neurological:  Negative for dizziness, tremors, speech difficulty and weakness.  Psychiatric/Behavioral:  Positive for decreased concentration. Negative for agitation, dysphoric mood, sleep disturbance and suicidal ideas. The patient is not nervous/anxious.    Objective:  BP 118/80 (BP Location: Left Arm)   Pulse 87   Temp 98.9 F (37.2 C) (Oral)   Ht '6\' 4"'$  (1.93 m)   Wt 243 lb (110.2 kg)   SpO2 97%   BMI 29.58 kg/m   BP Readings from Last 3 Encounters:  11/26/20 118/80  11/21/19 122/84  03/01/19 126/84    Wt Readings from Last 3 Encounters:  11/26/20 243 lb (110.2 kg)  11/21/19 241 lb (109.3 kg)  03/01/19 244 lb (110.7 kg)    Physical Exam Constitutional:      General: He is not in acute distress.    Appearance: Normal appearance. He is well-developed.     Comments: NAD  Eyes:     Conjunctiva/sclera:  Conjunctivae normal.     Pupils: Pupils are equal, round, and reactive to light.  Neck:     Thyroid: No thyromegaly.     Vascular: No JVD.  Cardiovascular:     Rate and Rhythm: Normal rate and regular rhythm.     Heart sounds: Normal heart sounds. No murmur heard.   No friction rub. No gallop.  Pulmonary:     Effort: Pulmonary effort is normal. No respiratory distress.     Breath sounds: Normal breath sounds. No wheezing or rales.  Chest:     Chest wall: No tenderness.  Abdominal:     General: Bowel sounds are normal. There is no distension.     Palpations: Abdomen is soft. There is no mass.     Tenderness: There is no abdominal tenderness. There is no guarding or rebound.  Musculoskeletal:        General: No tenderness. Normal range of motion.     Cervical back: Normal range of motion.  Lymphadenopathy:     Cervical: No cervical adenopathy.  Skin:    General: Skin is warm and dry.     Findings: No rash.  Neurological:     Mental Status: He is alert and oriented to person, place, and time.     Cranial Nerves: No cranial nerve deficit.     Motor: No abnormal muscle tone.     Coordination: Coordination normal.     Gait: Gait normal.  Deep Tendon Reflexes: Reflexes are normal and symmetric.  Psychiatric:        Behavior: Behavior normal.        Thought Content: Thought content normal.        Judgment: Judgment normal.  Testes -self exam  I spent 22 minutes in addition to time for CPX wellness examination in preparing to see the patient by review of recent labs, imaging and procedures, obtaining and reviewing separately obtained history, communicating with the patient, ordering medications, tests or procedures, and documenting clinical information in the EHR including the differential diagnosis, treatment, and any further evaluation and other management of brain fog and fatigue.         Lab Results  Component Value Date   WBC 4.6 11/21/2019   HGB 15.9 11/21/2019   HCT  47.5 11/21/2019   PLT 164 11/21/2019   GLUCOSE 104 (H) 11/21/2019   CHOL 237 (H) 11/21/2019   TRIG 138 11/21/2019   HDL 48 11/21/2019   LDLCALC 162 (H) 11/21/2019   ALT 13 11/21/2019   AST 13 11/21/2019   NA 141 11/21/2019   K 4.5 11/21/2019   CL 105 11/21/2019   CREATININE 0.96 11/21/2019   BUN 13 11/21/2019   CO2 29 11/21/2019   TSH 6.66 (H) 11/21/2019    DG Chest 2 View  Result Date: 05/19/2012 *RADIOLOGY REPORT* Clinical Data: Fever, cough. CHEST - 2 VIEW Comparison: 06/20/2009 Findings: No confluent airspace opacity.  Heart is upper limits normal in size.  Mild peribronchial thickening.  No effusions or acute bony abnormality. IMPRESSION: Mild bronchitic changes. Original Report Authenticated By: Rolm Baptise, M.D.    Assessment & Plan:    Walker Kehr, MD

## 2020-11-26 NOTE — Patient Instructions (Addendum)
You can try Lion's Mane Mushroom capsules for memory problems, decreased focus, mental fog, neuropathy ([email or web address])    There are natural ways to boost your testosterone:  1. Lose Weight If you're overweight, shedding the excess pounds may increase your testosterone levels, according to multiple research. Overweight men are more likely to have low testosterone levels to begin with, so this is an important trick to increase your body's testosterone production when you need it most.   2. Strength Training    Strength training is also known to boost testosterone levels, provided you are doing so intensely enough. When strength training to boost testosterone, you'll want to increase the weight and lower your number of reps, and then focus on exercises that work a large number of muscles.  3. Optimize Your Vitamin D Levels Vitamin D, a steroid hormone, is essential for the healthy development of the nucleus of the sperm cell, and helps maintain semen quality and sperm count. Vitamin D also increases levels of testosterone, which may boost libido. In one study, overweight men who were given vitamin D supplements had a significant increase in testosterone levels after one year.  4. Reduce Stress When you're under a lot of stress, your body releases high levels of the stress hormone cortisol. This hormone actually blocks the effects of testosterone, presumably because, from a biological standpoint, testosterone-associated behaviors (mating, competing, aggression) may have lowered your chances of survival in an emergency (hence, the "fight or flight" response is dominant, courtesy of cortisol).  5. Limit or Eliminate Sugar from Your Diet Testosterone levels decrease after you eat sugar, which is likely because the sugar leads to a high insulin level, another factor leading to low testosterone.  6. Eat Healthy Fats By healthy, this means not only mon- and polyunsaturated fats, like that found in  avocadoes and nuts, but also saturated, as these are essential for building testosterone. Research shows that a diet with less than 40 percent of energy as fat (and that mainly from animal sources, i.e. saturated) lead to a decrease in testosterone levels.  It's important to understand that your body requires saturated fats from animal and vegetable sources (such as meat, dairy, certain oils, and tropical plants like coconut) for optimal functioning, and if you neglect this important food group in favor of sugar, grains and other starchy carbs, your health and weight are almost guaranteed to suffer. Examples of healthy fats you can eat more of to give your testosterone levels a boost include:  Olives and Olive oil  Coconuts and coconut oil Butter made from organic milk  Raw nuts, such as, almonds or pecans Eggs Avocados   Meats Palm oil Unheated organic nut oils   7. "Testosterone boosters" containing Vitamin D-3, Niacin, Vitamin B-6, Vitamin B-12, Magnesium, Zinc, Selenium, D-Aspartic Acid, Fenugreed Seed Extract, Oystershell, Suma Extract, [place] Ginseng may be helpful as well.

## 2020-11-26 NOTE — Assessment & Plan Note (Signed)
On Vit D 

## 2020-11-30 DIAGNOSIS — R5383 Other fatigue: Secondary | ICD-10-CM | POA: Insufficient documentation

## 2020-11-30 LAB — TESTOSTERONE, FREE, TOTAL, SHBG
Sex Hormone Binding: 17.7 nmol/L (ref 16.5–55.9)
Testosterone, Free: 5.1 pg/mL — ABNORMAL LOW (ref 8.7–25.1)
Testosterone: 178 ng/dL — ABNORMAL LOW (ref 264–916)

## 2020-11-30 NOTE — Assessment & Plan Note (Addendum)
Post COVID fatigue.  We discussed options to treat including stimulants (temporarily).  The patient will think about it.  Obtain testosterone, FSH, TSH, LH

## 2020-11-30 NOTE — Assessment & Plan Note (Signed)

## 2020-11-30 NOTE — Assessment & Plan Note (Signed)
Post COVID brain fog. Try lion's main supplement

## 2020-12-30 ENCOUNTER — Other Ambulatory Visit: Payer: Self-pay | Admitting: Internal Medicine

## 2020-12-30 DIAGNOSIS — R7989 Other specified abnormal findings of blood chemistry: Secondary | ICD-10-CM

## 2021-03-04 ENCOUNTER — Ambulatory Visit: Payer: No Typology Code available for payment source | Admitting: Internal Medicine

## 2021-03-04 ENCOUNTER — Other Ambulatory Visit: Payer: Self-pay

## 2021-03-04 ENCOUNTER — Encounter: Payer: Self-pay | Admitting: Internal Medicine

## 2021-03-04 DIAGNOSIS — R7309 Other abnormal glucose: Secondary | ICD-10-CM | POA: Diagnosis not present

## 2021-03-04 DIAGNOSIS — L0501 Pilonidal cyst with abscess: Secondary | ICD-10-CM | POA: Diagnosis not present

## 2021-03-04 MED ORDER — DOXYCYCLINE HYCLATE 100 MG PO TABS: 100.0000 mg | ORAL_TABLET | Freq: Two times a day (BID) | ORAL | 2 refills | Status: DC

## 2021-03-04 NOTE — Assessment & Plan Note (Signed)
CBG 112 non-fasting Diet, exercise

## 2021-03-04 NOTE — Patient Instructions (Signed)
Pilonidal Cyst A pilonidal cyst is a fluid-filled sac that forms beneath the skin near the tailbone, at the top of the crease of the buttocks (pilonidal area). If the cyst is not large and not infected, it may not cause any problems. If the cyst becomes irritated or infected, it may get larger and fill with pus. An infected cyst is called an abscess. A pilonidal abscess may cause pain and swelling, and it may need to be drained or removed. What are the causes? The cause of this condition is not always known. In some cases, a hair that grows into your skin (ingrown hair) may be the cause. What increases the risk? You are more likely to get a pilonidal cyst if you: Are male. Have lots of hair near the crease of the buttocks. Are overweight. Have a dimple near the crease of the buttocks. Wear tight clothing. Do not bathe or shower often. Sit for long periods of time. What are the signs or symptoms? Signs and symptoms of a pilonidal cyst may include pain, swelling, redness, and warmth in the pilonidal area. Depending on how big the cyst is, you may be able to feel a lump near your tailbone. If your cyst becomes infected, symptoms may include: Pus or fluid drainage. Fever. Pain, swelling, and redness getting worse. The lump getting bigger. How is this diagnosed? This condition may be diagnosed based on: Your symptoms and medical history. A physical exam. A blood test to check for infection. Testing a pus sample, if applicable. How is this treated? If your cyst does not cause symptoms, you may not need any treatment. If your cyst bothers you or is infected, you may need a procedure to drain or remove the cyst. Depending on the size, location, and severity of your cyst, your health care provider may: Make an incision in the cyst and drain it (incision and drainage). Open and drain the cyst, and then stitch the wound so that it stays open while it heals (marsupialization). You will be given  instructions about how to care for your open wound while it heals. Remove all or part of the cyst, and then close the wound (cyst removal). You may need to take antibiotic medicines before your procedure. Follow these instructions at home: Medicines Take over-the-counter and prescription medicines only as told by your health care provider. If you were prescribed an antibiotic medicine, take it as told by your health care provider. Do not stop taking the antibiotic even if you start to feel better. General instructions Keep the area around your pilonidal cyst clean and dry. If there is fluid or pus draining from your cyst: Cover the area with a clean bandage (dressing) as needed. Wash the area gently with soap and water. Pat the area dry with a clean towel. Do not rub the area because that may cause bleeding. Remove hair from the area around the cyst only if your health care provider tells you to do this. Do not wear tight pants or sit in one position for long periods at a time. Keep all follow-up visits as told by your health care provider. This is important. Contact a health care provider if you have: New redness, swelling, or pain. A fever. Severe pain. Summary A pilonidal cyst is a fluid-filled sac that forms beneath the skin near the tailbone, at the top of the crease of the buttocks (pilonidal area). If the cyst becomes irritated or infected, it may get larger and fill with pus. An infected  cyst is called an abscess. The cause of this condition is not always known. In some cases, a hair that grows into your skin (ingrown hair) may be the cause. If your cyst does not cause symptoms, you may not need any treatment. If your cyst bothers you or is infected, you may need a procedure to drain or remove the cyst. This information is not intended to replace advice given to you by your health care provider. Make sure you discuss any questions you have with your health care provider. Document  Revised: 02/28/2020 Document Reviewed: 02/28/2020 Elsevier Patient Education  2022 [employer].

## 2021-03-04 NOTE — Progress Notes (Signed)
   Subjective:  Patient ID: Ralph Webster, male    DOB: [birth year]  Age: 35 y.o. MRN: [id number]  CC: Recurrent Skin Infections (Abcess on his back)   HPI Ralph Webster presents for a coccyx area abscess - recurrent over past 6 weeks w/drainage. It drained last last night at work. CBG 112.  Outpatient Medications Prior to Visit  Medication Sig Dispense Refill   Cholecalciferol (VITAMIN D3) 2000 units capsule Take 1 capsule (2,000 Units total) by mouth daily. 100 capsule 3   No facility-administered medications prior to visit.    ROS: Review of Systems  Constitutional:  Negative for chills and fever.  Skin:  Positive for color change and wound.   Objective:  BP (!) 130/92 (BP Location: Left Arm)   Pulse 77   Temp 98.5 F (36.9 C) (Oral)   SpO2 96%   BP Readings from Last 3 Encounters:  03/04/21 (!) 130/92  11/26/20 118/80  11/21/19 122/84    Wt Readings from Last 3 Encounters:  11/26/20 243 lb (110.2 kg)  11/21/19 241 lb (109.3 kg)  03/01/19 244 lb (110.7 kg)    Physical Exam Constitutional:      General: He is not in acute distress.    Appearance: Normal appearance. He is not toxic-appearing.  Skin:    Findings: Lesion present.  Neurological:     Mental Status: He is oriented to person, place, and time.  Psychiatric:        Mood and Affect: Mood normal.  <1cm cm infiltrate over coccyx, no active drainage  Lab Results  Component Value Date   WBC 4.9 11/26/2020   HGB 15.0 11/26/2020   HCT 44.1 11/26/2020   PLT 154.0 11/26/2020   GLUCOSE 92 11/26/2020   CHOL 188 11/26/2020   TRIG 78.0 11/26/2020   HDL 45.60 11/26/2020   LDLCALC 127 (H) 11/26/2020   ALT 11 11/26/2020   ALT 11 11/26/2020   AST 14 11/26/2020   AST 14 11/26/2020   NA 139 11/26/2020   K 3.7 11/26/2020   CL 103 11/26/2020   CREATININE 0.85 11/26/2020   BUN 16 11/26/2020   CO2 30 11/26/2020   TSH 3.85 11/26/2020    DG Chest 2 View  Result Date: 05/19/2012 *RADIOLOGY REPORT* Clinical  Data: Fever, cough. CHEST - 2 VIEW Comparison: 06/20/2009 Findings: No confluent airspace opacity.  Heart is upper limits normal in size.  Mild peribronchial thickening.  No effusions or acute bony abnormality. IMPRESSION: Mild bronchitic changes. Original Report Authenticated By: Rolm Baptise, M.D.    Assessment & Plan:   Problem List Items Addressed This Visit     Elevated glucose     CBG 112 non-fasting Diet, exercise      Pilonidal abscess    New - a coccyx area abscess - recurrent over past 6 weeks w/drainage. It drained last last night at work. CBG 112. Check CBG Doxy x 10 days prn General surgery referral      Relevant Orders   Ambulatory referral to General Surgery      Meds ordered this encounter  Medications   doxycycline (VIBRA-TABS) 100 MG tablet    Sig: Take 1 tablet (100 mg total) by mouth 2 (two) times daily.    Dispense:  20 tablet    Refill:  2      Follow-up: Return for a follow-up visit.  Walker Kehr, MD

## 2021-03-04 NOTE — Assessment & Plan Note (Addendum)
New - a coccyx area abscess - recurrent over past 6 weeks w/drainage. It drained last last night at work. CBG 112. Check CBG Doxy x 10 days prn General surgery referral

## 2021-03-10 ENCOUNTER — Ambulatory Visit: Payer: No Typology Code available for payment source | Admitting: Internal Medicine

## 2021-04-21 ENCOUNTER — Ambulatory Visit: Payer: Self-pay | Admitting: Surgery

## 2021-04-21 ENCOUNTER — Encounter: Payer: Self-pay | Admitting: Surgery

## 2021-08-04 DIAGNOSIS — Z8614 Personal history of Methicillin resistant Staphylococcus aureus infection: Secondary | ICD-10-CM | POA: Insufficient documentation

## 2022-05-13 ENCOUNTER — Encounter: Payer: Self-pay | Admitting: Internal Medicine

## 2022-05-13 ENCOUNTER — Ambulatory Visit: Payer: PRIVATE HEALTH INSURANCE | Admitting: Internal Medicine

## 2022-05-13 VITALS — BP 118/68 | HR 63 | Temp 99.2°F | Ht 76.0 in | Wt 257.0 lb

## 2022-05-13 DIAGNOSIS — G9331 Postviral fatigue syndrome: Secondary | ICD-10-CM | POA: Diagnosis not present

## 2022-05-13 DIAGNOSIS — E559 Vitamin D deficiency, unspecified: Secondary | ICD-10-CM

## 2022-05-13 DIAGNOSIS — L0501 Pilonidal cyst with abscess: Secondary | ICD-10-CM

## 2022-05-13 DIAGNOSIS — Z0000 Encounter for general adult medical examination without abnormal findings: Secondary | ICD-10-CM | POA: Diagnosis not present

## 2022-05-13 DIAGNOSIS — R7989 Other specified abnormal findings of blood chemistry: Secondary | ICD-10-CM

## 2022-05-13 LAB — COMPREHENSIVE METABOLIC PANEL
ALT: 15 U/L (ref 0–53)
AST: 18 U/L (ref 0–37)
Albumin: 4.8 g/dL (ref 3.5–5.2)
Alkaline Phosphatase: 40 U/L (ref 39–117)
BUN: 16 mg/dL (ref 6–23)
CO2: 28 mEq/L (ref 19–32)
Calcium: 9.2 mg/dL (ref 8.4–10.5)
Chloride: 104 mEq/L (ref 96–112)
Creatinine, Ser: 1.09 mg/dL (ref 0.40–1.50)
GFR: 86.31 mL/min (ref >60.00)
Glucose, Bld: 92 mg/dL (ref 70–99)
Potassium: 3.8 mEq/L (ref 3.5–5.1)
Sodium: 139 mEq/L (ref 135–145)
Total Bilirubin: 0.8 mg/dL (ref 0.2–1.2)
Total Protein: 7.9 g/dL (ref 6.0–8.3)

## 2022-05-13 LAB — CBC WITH DIFFERENTIAL/PLATELET
Basophils Absolute: 0 10*3/uL (ref 0.0–0.1)
Basophils Relative: 0.8 % (ref 0.0–3.0)
Eosinophils Absolute: 0.1 10*3/uL (ref 0.0–0.7)
Eosinophils Relative: 2.3 % (ref 0.0–5.0)
HCT: 44.6 % (ref 39.0–52.0)
Hemoglobin: 15.3 g/dL (ref 13.0–17.0)
Lymphocytes Relative: 46.5 % — ABNORMAL HIGH (ref 12.0–46.0)
Lymphs Abs: 2.4 10*3/uL (ref 0.7–4.0)
MCHC: 34.4 g/dL (ref 30.0–36.0)
MCV: 89.2 fl (ref 78.0–100.0)
Monocytes Absolute: 0.4 10*3/uL (ref 0.1–1.0)
Monocytes Relative: 8.1 % (ref 3.0–12.0)
Neutro Abs: 2.2 10*3/uL (ref 1.4–7.7)
Neutrophils Relative %: 42.3 % — ABNORMAL LOW (ref 43.0–77.0)
Platelets: 181 10*3/uL (ref 150.0–400.0)
RBC: 5 Mil/uL (ref 4.22–5.81)
RDW: 13.4 % (ref 11.5–15.5)
WBC: 5.3 10*3/uL (ref 4.0–10.5)

## 2022-05-13 LAB — URINALYSIS
Bilirubin Urine: NEGATIVE
Hgb urine dipstick: NEGATIVE
Ketones, ur: NEGATIVE
Leukocytes,Ua: NEGATIVE
Nitrite: NEGATIVE
Specific Gravity, Urine: <=1.005 — AB (ref 1.000–1.030)
Total Protein, Urine: NEGATIVE
Urine Glucose: NEGATIVE
Urobilinogen, UA: 0.2 (ref 0.0–1.0)
pH: 6 (ref 5.0–8.0)

## 2022-05-13 LAB — LIPID PANEL
Cholesterol: 220 mg/dL — ABNORMAL HIGH (ref 0–200)
HDL: 49.2 mg/dL (ref >39.00)
LDL Cholesterol: 151 mg/dL — ABNORMAL HIGH (ref 0–99)
NonHDL: 171.04
Total CHOL/HDL Ratio: 4
Triglycerides: 99 mg/dL (ref 0.0–149.0)
VLDL: 19.8 mg/dL (ref 0.0–40.0)

## 2022-05-13 LAB — T4, FREE: Free T4: 0.65 ng/dL (ref 0.60–1.60)

## 2022-05-13 LAB — TSH: TSH: 7.54 u[IU]/mL — ABNORMAL HIGH (ref 0.35–5.50)

## 2022-05-13 NOTE — Assessment & Plan Note (Signed)
On Vit D 

## 2022-05-13 NOTE — Assessment & Plan Note (Signed)

## 2022-05-13 NOTE — Progress Notes (Signed)
Subjective:  Patient ID: Ralph Webster, male    DOB: 1985/12/30  Age: 37 y.o. MRN: [id number]  CC: Annual Exam   HPI Ralph Webster presents for a well exam  Outpatient Medications Prior to Visit  Medication Sig Dispense Refill   acetaminophen (TYLENOL) 500 MG tablet Take by mouth.     ibuprofen (ADVIL) 200 MG tablet Take by mouth.     Cholecalciferol (VITAMIN D3) 2000 units capsule Take 1 capsule (2,000 Units total) by mouth daily. 100 capsule 3   doxycycline (VIBRA-TABS) 100 MG tablet Take 1 tablet (100 mg total) by mouth 2 (two) times daily. 20 tablet 2   No facility-administered medications prior to visit.    ROS: Review of Systems  Constitutional:  Positive for fatigue. Negative for appetite change and unexpected weight change.  HENT:  Negative for congestion, nosebleeds, sneezing, sore throat and trouble swallowing.   Eyes:  Negative for itching and visual disturbance.  Respiratory:  Negative for cough.   Cardiovascular:  Negative for chest pain, palpitations and leg swelling.  Gastrointestinal:  Negative for abdominal distention, blood in stool, diarrhea and nausea.  Genitourinary:  Negative for frequency and hematuria.  Musculoskeletal:  Negative for back pain, gait problem, joint swelling and neck pain.  Skin:  Negative for rash.  Neurological:  Negative for dizziness, tremors, speech difficulty and weakness.  Psychiatric/Behavioral:  Negative for agitation, dysphoric mood and sleep disturbance. The patient is not nervous/anxious.     Objective:  BP 118/68 (BP Location: Right Arm, Patient Position: Sitting, Cuff Size: Normal)   Pulse 63   Temp 99.2 F (37.3 C) (Oral)   Ht '6\' 4"'$  (1.93 m)   Wt 257 lb (116.6 kg)   SpO2 98%   BMI 31.28 kg/m   BP Readings from Last 3 Encounters:  05/13/22 118/68  03/04/21 (!) 130/92  11/26/20 118/80    Wt Readings from Last 3 Encounters:  05/13/22 257 lb (116.6 kg)  11/26/20 243 lb (110.2 kg)  11/21/19 241 lb (109.3 kg)     Physical Exam Constitutional:      General: He is not in acute distress.    Appearance: Normal appearance. He is well-developed.     Comments: NAD  Eyes:     Conjunctiva/sclera: Conjunctivae normal.     Pupils: Pupils are equal, round, and reactive to light.  Neck:     Thyroid: No thyromegaly.     Vascular: No JVD.  Cardiovascular:     Rate and Rhythm: Normal rate and regular rhythm.     Heart sounds: Normal heart sounds. No murmur heard.    No friction rub. No gallop.  Pulmonary:     Effort: Pulmonary effort is normal. No respiratory distress.     Breath sounds: Normal breath sounds. No wheezing or rales.  Chest:     Chest wall: No tenderness.  Abdominal:     General: Bowel sounds are normal. There is no distension.     Palpations: Abdomen is soft. There is no mass.     Tenderness: There is no abdominal tenderness. There is no guarding or rebound.  Musculoskeletal:        General: No tenderness. Normal range of motion.     Cervical back: Normal range of motion.  Lymphadenopathy:     Cervical: No cervical adenopathy.  Skin:    General: Skin is warm and dry.     Findings: No rash.  Neurological:     Mental Status: He is alert and  oriented to person, place, and time.     Cranial Nerves: No cranial nerve deficit.     Motor: No abnormal muscle tone.     Coordination: Coordination normal.     Gait: Gait normal.     Deep Tendon Reflexes: Reflexes are normal and symmetric.  Psychiatric:        Behavior: Behavior normal.        Thought Content: Thought content normal.        Judgment: Judgment normal.     Lab Results  Component Value Date   WBC 4.9 11/26/2020   HGB 15.0 11/26/2020   HCT 44.1 11/26/2020   PLT 154.0 11/26/2020   GLUCOSE 92 11/26/2020   CHOL 188 11/26/2020   TRIG 78.0 11/26/2020   HDL 45.60 11/26/2020   LDLCALC 127 (H) 11/26/2020   ALT 11 11/26/2020   ALT 11 11/26/2020   AST 14 11/26/2020   AST 14 11/26/2020   NA 139 11/26/2020   K 3.7  11/26/2020   CL 103 11/26/2020   CREATININE 0.85 11/26/2020   BUN 16 11/26/2020   CO2 30 11/26/2020   TSH 3.85 11/26/2020    DG Chest 2 View  Result Date: 05/19/2012 *RADIOLOGY REPORT* Clinical Data: Fever, cough. CHEST - 2 VIEW Comparison: 06/20/2009 Findings: No confluent airspace opacity.  Heart is upper limits normal in size.  Mild peribronchial thickening.  No effusions or acute bony abnormality. IMPRESSION: Mild bronchitic changes. Original Report Authenticated By: Rolm Baptise, M.D.    Assessment & Plan:   Problem List Items Addressed This Visit       Musculoskeletal and Integument   Pilonidal abscess    Removed 2023 - Dr Johney Maine        Other   Well adult exam - Primary     We discussed age appropriate health related issues, including available/recomended screening tests and vaccinations. Labs were ordered to be later reviewed . All questions were answered. We discussed one or more of the following - seat belt use, use of sunscreen/sun exposure exercise, safe sex, fall risk reduction, second hand smoke exposure, firearm use and storage, seat belt use, a need for adhering to healthy diet and exercise. Labs were ordered.  All questions were answered.      Relevant Orders   TSH   Urinalysis   CBC with Differential/Platelet   Lipid panel   Comprehensive metabolic panel   T4, free   Testosterone, Free, Total, SHBG   Vitamin D deficiency    On Vit D      Postviral fatigue syndrome   Relevant Orders   TSH   T4, free   Other Visit Diagnoses     Low testosterone in male       Relevant Orders   T4, free   Testosterone, Free, Total, SHBG         No orders of the defined types were placed in this encounter.     Follow-up: No follow-ups on file.  Walker Kehr, MD

## 2022-05-13 NOTE — Assessment & Plan Note (Signed)
Removed 2023 - Dr Johney Maine

## 2022-05-13 NOTE — Assessment & Plan Note (Signed)
Overall better 

## 2022-05-17 LAB — TESTOSTERONE, FREE, TOTAL, SHBG
Sex Hormone Binding: 17.8 nmol/L (ref 16.5–55.9)
Testosterone, Free: 3.8 pg/mL — ABNORMAL LOW (ref 8.7–25.1)
Testosterone: 105 ng/dL — ABNORMAL LOW (ref 264–916)

## 2022-05-18 ENCOUNTER — Encounter: Payer: Self-pay | Admitting: Internal Medicine

## 2022-05-23 ENCOUNTER — Other Ambulatory Visit: Payer: Self-pay | Admitting: Internal Medicine

## 2022-05-23 DIAGNOSIS — R7989 Other specified abnormal findings of blood chemistry: Secondary | ICD-10-CM

## 2022-05-23 MED ORDER — CLOMID 50 MG PO TABS: 50.0000 mg | ORAL_TABLET | Freq: Every day | ORAL | 3 refills | Status: DC

## 2022-11-23 DIAGNOSIS — E349 Endocrine disorder, unspecified: Secondary | ICD-10-CM | POA: Insufficient documentation

## 2022-11-23 DIAGNOSIS — E063 Autoimmune thyroiditis: Secondary | ICD-10-CM | POA: Insufficient documentation

## 2023-05-18 ENCOUNTER — Encounter: Payer: PRIVATE HEALTH INSURANCE | Admitting: Internal Medicine

## 2023-09-08 ENCOUNTER — Encounter (HOSPITAL_COMMUNITY): Payer: Self-pay

## 2023-09-15 ENCOUNTER — Ambulatory Visit (INDEPENDENT_AMBULATORY_CARE_PROVIDER_SITE_OTHER): Payer: PRIVATE HEALTH INSURANCE | Admitting: Internal Medicine

## 2023-09-15 ENCOUNTER — Encounter: Payer: Self-pay | Admitting: Internal Medicine

## 2023-09-15 VITALS — BP 112/70 | HR 63 | Temp 97.4°F | Ht 76.0 in | Wt 234.0 lb

## 2023-09-15 DIAGNOSIS — Z0000 Encounter for general adult medical examination without abnormal findings: Secondary | ICD-10-CM | POA: Diagnosis not present

## 2023-09-15 DIAGNOSIS — R1031 Right lower quadrant pain: Secondary | ICD-10-CM

## 2023-09-15 DIAGNOSIS — E559 Vitamin D deficiency, unspecified: Secondary | ICD-10-CM

## 2023-09-15 DIAGNOSIS — E063 Autoimmune thyroiditis: Secondary | ICD-10-CM

## 2023-09-15 DIAGNOSIS — E349 Endocrine disorder, unspecified: Secondary | ICD-10-CM

## 2023-09-15 DIAGNOSIS — H60332 Swimmer's ear, left ear: Secondary | ICD-10-CM | POA: Diagnosis not present

## 2023-09-15 DIAGNOSIS — G8929 Other chronic pain: Secondary | ICD-10-CM

## 2023-09-15 LAB — LIPID PANEL
Cholesterol: 214 mg/dL — ABNORMAL HIGH (ref 0–200)
HDL: 57 mg/dL (ref >39.00)
LDL Cholesterol: 140 mg/dL — ABNORMAL HIGH (ref 0–99)
NonHDL: 157.09
Total CHOL/HDL Ratio: 4
Triglycerides: 83 mg/dL (ref 0.0–149.0)
VLDL: 16.6 mg/dL (ref 0.0–40.0)

## 2023-09-15 LAB — COMPREHENSIVE METABOLIC PANEL WITH GFR
ALT: 14 U/L (ref 0–53)
AST: 17 U/L (ref 0–37)
Albumin: 4.8 g/dL (ref 3.5–5.2)
Alkaline Phosphatase: 53 U/L (ref 39–117)
BUN: 16 mg/dL (ref 6–23)
CO2: 27 meq/L (ref 19–32)
Calcium: 9.4 mg/dL (ref 8.4–10.5)
Chloride: 103 meq/L (ref 96–112)
Creatinine, Ser: 0.98 mg/dL (ref 0.40–1.50)
GFR: 97.15 mL/min (ref >60.00)
Glucose, Bld: 111 mg/dL — ABNORMAL HIGH (ref 70–99)
Potassium: 4.4 meq/L (ref 3.5–5.1)
Sodium: 138 meq/L (ref 135–145)
Total Bilirubin: 0.9 mg/dL (ref 0.2–1.2)
Total Protein: 8 g/dL (ref 6.0–8.3)

## 2023-09-15 LAB — CBC WITH DIFFERENTIAL/PLATELET
Basophils Absolute: 0 10*3/uL (ref 0.0–0.1)
Basophils Relative: 0.6 % (ref 0.0–3.0)
Eosinophils Absolute: 0.1 10*3/uL (ref 0.0–0.7)
Eosinophils Relative: 1.4 % (ref 0.0–5.0)
HCT: 46.8 % (ref 39.0–52.0)
Hemoglobin: 15.7 g/dL (ref 13.0–17.0)
Lymphocytes Relative: 33.6 % (ref 12.0–46.0)
Lymphs Abs: 1.5 10*3/uL (ref 0.7–4.0)
MCHC: 33.5 g/dL (ref 30.0–36.0)
MCV: 89.5 fl (ref 78.0–100.0)
Monocytes Absolute: 0.4 10*3/uL (ref 0.1–1.0)
Monocytes Relative: 8.5 % (ref 3.0–12.0)
Neutro Abs: 2.4 10*3/uL (ref 1.4–7.7)
Neutrophils Relative %: 55.9 % (ref 43.0–77.0)
Platelets: 159 10*3/uL (ref 150.0–400.0)
RBC: 5.22 Mil/uL (ref 4.22–5.81)
RDW: 13.4 % (ref 11.5–15.5)
WBC: 4.4 10*3/uL (ref 4.0–10.5)

## 2023-09-15 LAB — URINALYSIS
Bilirubin Urine: NEGATIVE
Hgb urine dipstick: NEGATIVE
Ketones, ur: NEGATIVE
Leukocytes,Ua: NEGATIVE
Nitrite: NEGATIVE
Specific Gravity, Urine: <=1.005 — AB (ref 1.000–1.030)
Total Protein, Urine: NEGATIVE
Urine Glucose: NEGATIVE
Urobilinogen, UA: 0.2 (ref 0.0–1.0)
pH: 7 (ref 5.0–8.0)

## 2023-09-15 LAB — TESTOSTERONE: Testosterone: 260.17 ng/dL — ABNORMAL LOW (ref 300.00–890.00)

## 2023-09-15 LAB — T4, FREE: Free T4: 0.96 ng/dL (ref 0.60–1.60)

## 2023-09-15 LAB — TSH: TSH: 2.24 u[IU]/mL (ref 0.35–5.50)

## 2023-09-15 MED ORDER — CLOTRIMAZOLE-BETAMETHASONE 1-0.05 % EX CREA: 1.0000 | TOPICAL_CREAM | Freq: Two times a day (BID) | CUTANEOUS | 1 refills | Status: AC

## 2023-09-15 NOTE — Assessment & Plan Note (Signed)
 Clean w/a Q tip Lotrisone prn

## 2023-09-15 NOTE — Progress Notes (Signed)
 Subjective:  Patient ID: Ralph Webster, male    DOB: [birth year]  Age: 38 y.o. MRN: [id number]  CC: Annual Exam   HPI Ralph Webster presents for a well exam C/o R groin pain - MSK C/o L ear drainage - itching when wax dries out (pt saw Dr Jodelle Mungo)  Outpatient Medications Prior to Visit  Medication Sig Dispense Refill   levothyroxine (SYNTHROID) 50 MCG tablet Take 50 mcg by mouth.     acetaminophen (TYLENOL) 500 MG tablet Take by mouth.     clomiPHENE  (CLOMID ) 50 MG tablet Take 1 tablet (50 mg total) by mouth daily. 30 tablet 3   ibuprofen  (ADVIL ) 200 MG tablet Take by mouth.     No facility-administered medications prior to visit.    ROS: Review of Systems  Constitutional:  Negative for appetite change, fatigue and unexpected weight change.  HENT:  Negative for congestion, nosebleeds, sneezing, sore throat and trouble swallowing.   Eyes:  Negative for itching and visual disturbance.  Respiratory:  Negative for cough.   Cardiovascular:  Negative for chest pain, palpitations and leg swelling.  Gastrointestinal:  Negative for abdominal distention, blood in stool, diarrhea and nausea.  Genitourinary:  Negative for frequency and hematuria.  Musculoskeletal:  Negative for back pain, gait problem, joint swelling and neck pain.  Skin:  Negative for rash.  Neurological:  Negative for dizziness, tremors, speech difficulty and weakness.  Psychiatric/Behavioral:  Negative for agitation, dysphoric mood and sleep disturbance. The patient is not nervous/anxious.     Objective:  BP 112/70   Pulse 63   Temp (!) 97.4 F (36.3 C) (Oral)   Ht 6\' 4"  (1.93 m)   Wt 234 lb (106.1 kg)   SpO2 98%   BMI 28.48 kg/m   BP Readings from Last 3 Encounters:  09/15/23 112/70  05/13/22 118/68  03/04/21 (!) 130/92    Wt Readings from Last 3 Encounters:  09/15/23 234 lb (106.1 kg)  05/13/22 257 lb (116.6 kg)  11/26/20 243 lb (110.2 kg)    Physical Exam Constitutional:      General: He is not in  acute distress.    Appearance: He is well-developed.     Comments: NAD  Eyes:     Conjunctiva/sclera: Conjunctivae normal.     Pupils: Pupils are equal, round, and reactive to light.  Neck:     Thyroid : No thyromegaly.     Vascular: No JVD.  Cardiovascular:     Rate and Rhythm: Normal rate and regular rhythm.     Heart sounds: Normal heart sounds. No murmur heard.    No friction rub. No gallop.  Pulmonary:     Effort: Pulmonary effort is normal. No respiratory distress.     Breath sounds: Normal breath sounds. No wheezing or rales.  Chest:     Chest wall: No tenderness.  Abdominal:     General: Bowel sounds are normal. There is no distension.     Palpations: Abdomen is soft. There is no mass.     Tenderness: There is no abdominal tenderness. There is no guarding or rebound.  Musculoskeletal:        General: Tenderness present. Normal range of motion.     Cervical back: Normal range of motion.  Lymphadenopathy:     Cervical: No cervical adenopathy.  Skin:    General: Skin is warm and dry.     Findings: No rash.  Neurological:     Mental Status: He is alert and oriented to  person, place, and time.     Cranial Nerves: No cranial nerve deficit.     Motor: No abnormal muscle tone.     Coordination: Coordination normal.     Gait: Gait normal.     Deep Tendon Reflexes: Reflexes are normal and symmetric.  Psychiatric:        Behavior: Behavior normal.        Thought Content: Thought content normal.        Judgment: Judgment normal.   R groin NT Testes - self exam  Lab Results  Component Value Date   WBC 5.3 05/13/2022   HGB 15.3 05/13/2022   HCT 44.6 05/13/2022   PLT 181.0 05/13/2022   GLUCOSE 92 05/13/2022   CHOL 220 (H) 05/13/2022   TRIG 99.0 05/13/2022   HDL 49.20 05/13/2022   LDLCALC 151 (H) 05/13/2022   ALT 15 05/13/2022   AST 18 05/13/2022   NA 139 05/13/2022   K 3.8 05/13/2022   CL 104 05/13/2022   CREATININE 1.09 05/13/2022   BUN 16 05/13/2022   CO2 28  05/13/2022   TSH 7.54 (H) 05/13/2022    DG Chest 2 View Result Date: 05/19/2012 *RADIOLOGY REPORT* Clinical Data: Fever, cough. CHEST - 2 VIEW Comparison: 06/20/2009 Findings: No confluent airspace opacity.  Heart is upper limits normal in size.  Mild peribronchial thickening.  No effusions or acute bony abnormality. IMPRESSION: Mild bronchitic changes. Original Report Authenticated By: Janeece Mechanic, M.D.    Assessment & Plan:   Problem List Items Addressed This Visit     Well adult exam - Primary    We discussed age appropriate health related issues, including available/recomended screening tests and vaccinations. Labs were ordered to be later reviewed . All questions were answered. We discussed one or more of the following - seat belt use, use of sunscreen/sun exposure exercise, safe sex, fall risk reduction, second hand smoke exposure, firearm use and storage, seat belt use, a need for adhering to healthy diet and exercise. Labs were ordered.  All questions were answered.      Relevant Orders   TSH   Urinalysis   CBC with Differential/Platelet   Lipid panel   Comprehensive metabolic panel with GFR   T4, free   External otitis of left ear   Clean w/a Q tip Lotrisone prn      Vitamin D deficiency   On Vit D      Hashimoto's thyroiditis   On Levothyroxine F/u w/Dr Marcella Serge      Relevant Medications   levothyroxine (SYNTHROID) 50 MCG tablet   Testosterone  deficiency   Resolved w/Hashimoto treatment F/u w/Dr Marcella Serge      Relevant Orders   Testosterone    Groin pain, chronic, right   MSK - resolving         Meds ordered this encounter  Medications   clotrimazole-betamethasone (LOTRISONE) cream    Sig: Apply 1 Application topically 2 (two) times daily. Use prn    Dispense:  45 g    Refill:  1      Follow-up: Return in about 1 year (around 09/14/2024) for Wellness Exam.  Anitra Barn, MD

## 2023-09-15 NOTE — Assessment & Plan Note (Signed)
 On Vit D

## 2023-09-15 NOTE — Assessment & Plan Note (Signed)
 MSK - resolving

## 2023-09-15 NOTE — Assessment & Plan Note (Signed)
 On Levothyroxine F/u w/Dr Marcella Serge

## 2023-09-15 NOTE — Assessment & Plan Note (Signed)

## 2023-09-15 NOTE — Assessment & Plan Note (Signed)
 Resolved w/Hashimoto treatment F/u w/Dr Marcella Serge

## 2023-09-16 ENCOUNTER — Ambulatory Visit: Payer: Self-pay | Admitting: Internal Medicine

## 2023-09-20 ENCOUNTER — Other Ambulatory Visit: Payer: Self-pay | Admitting: Internal Medicine

## 2023-09-20 DIAGNOSIS — R7309 Other abnormal glucose: Secondary | ICD-10-CM

## 2023-09-21 ENCOUNTER — Other Ambulatory Visit (INDEPENDENT_AMBULATORY_CARE_PROVIDER_SITE_OTHER): Payer: PRIVATE HEALTH INSURANCE

## 2023-09-21 DIAGNOSIS — R7309 Other abnormal glucose: Secondary | ICD-10-CM | POA: Diagnosis not present

## 2023-09-21 LAB — HEMOGLOBIN A1C: Hgb A1c MFr Bld: 5.6 % (ref 4.6–6.5)

## 2023-09-23 ENCOUNTER — Ambulatory Visit: Payer: Self-pay | Admitting: Internal Medicine

## 2024-06-21 ENCOUNTER — Ambulatory Visit: Payer: PRIVATE HEALTH INSURANCE | Admitting: Internal Medicine
